# Patient Record
Sex: Female | Born: 1955 | Race: White | Hispanic: No | Marital: Married | Smoking: Never smoker
Health system: Southern US, Community
[De-identification: ages and names within clinical notes are randomized; demographics above are authoritative.]

## PROBLEM LIST (undated history)

## (undated) DIAGNOSIS — E785 Hyperlipidemia, unspecified: Secondary | ICD-10-CM

## (undated) DIAGNOSIS — K219 Gastro-esophageal reflux disease without esophagitis: Secondary | ICD-10-CM

## (undated) DIAGNOSIS — F419 Anxiety disorder, unspecified: Secondary | ICD-10-CM

## (undated) DIAGNOSIS — L570 Actinic keratosis: Secondary | ICD-10-CM

## (undated) DIAGNOSIS — T753XXA Motion sickness, initial encounter: Secondary | ICD-10-CM

## (undated) DIAGNOSIS — F32A Depression, unspecified: Secondary | ICD-10-CM

## (undated) DIAGNOSIS — F329 Major depressive disorder, single episode, unspecified: Secondary | ICD-10-CM

## (undated) HISTORY — DX: Major depressive disorder, single episode, unspecified: F32.9

## (undated) HISTORY — DX: Depression, unspecified: F32.A

## (undated) HISTORY — DX: Anxiety disorder, unspecified: F41.9

## (undated) HISTORY — DX: Gastro-esophageal reflux disease without esophagitis: K21.9

## (undated) HISTORY — DX: Actinic keratosis: L57.0

## (undated) HISTORY — DX: Hyperlipidemia, unspecified: E78.5

---

## 1982-03-30 HISTORY — PX: TUBAL LIGATION: SHX77

## 2004-07-03 ENCOUNTER — Ambulatory Visit: Payer: Self-pay

## 2004-07-16 ENCOUNTER — Ambulatory Visit: Payer: Self-pay

## 2005-10-14 ENCOUNTER — Ambulatory Visit: Payer: Self-pay

## 2006-02-24 ENCOUNTER — Ambulatory Visit: Payer: Self-pay | Admitting: Gastroenterology

## 2006-11-25 ENCOUNTER — Ambulatory Visit: Payer: Self-pay

## 2007-11-29 ENCOUNTER — Ambulatory Visit: Payer: Self-pay

## 2008-06-01 ENCOUNTER — Ambulatory Visit: Payer: Self-pay | Admitting: Family Medicine

## 2008-12-06 ENCOUNTER — Ambulatory Visit: Payer: Self-pay | Admitting: Family Medicine

## 2009-01-07 ENCOUNTER — Ambulatory Visit: Payer: Self-pay | Admitting: Family Medicine

## 2009-03-30 HISTORY — PX: COLONOSCOPY: SHX174

## 2009-07-18 ENCOUNTER — Ambulatory Visit: Payer: Self-pay | Admitting: Family Medicine

## 2009-12-11 ENCOUNTER — Ambulatory Visit: Payer: Self-pay | Admitting: Family Medicine

## 2011-02-10 ENCOUNTER — Ambulatory Visit: Payer: Self-pay | Admitting: Family Medicine

## 2012-02-12 ENCOUNTER — Ambulatory Visit: Payer: Self-pay | Admitting: Family Medicine

## 2013-01-24 ENCOUNTER — Ambulatory Visit: Payer: Self-pay | Admitting: Family Medicine

## 2013-01-27 ENCOUNTER — Ambulatory Visit: Payer: Self-pay | Admitting: Family Medicine

## 2013-02-13 ENCOUNTER — Ambulatory Visit: Payer: Self-pay | Admitting: Family Medicine

## 2014-02-19 ENCOUNTER — Ambulatory Visit: Payer: Self-pay | Admitting: Family Medicine

## 2014-04-02 ENCOUNTER — Ambulatory Visit: Payer: Self-pay | Admitting: Family Medicine

## 2014-09-14 ENCOUNTER — Other Ambulatory Visit: Payer: Self-pay

## 2014-09-14 ENCOUNTER — Other Ambulatory Visit: Payer: Self-pay | Admitting: Family Medicine

## 2014-09-14 DIAGNOSIS — K219 Gastro-esophageal reflux disease without esophagitis: Secondary | ICD-10-CM | POA: Insufficient documentation

## 2014-09-14 DIAGNOSIS — F419 Anxiety disorder, unspecified: Secondary | ICD-10-CM

## 2014-09-14 DIAGNOSIS — F32A Depression, unspecified: Secondary | ICD-10-CM | POA: Insufficient documentation

## 2014-09-14 DIAGNOSIS — F329 Major depressive disorder, single episode, unspecified: Secondary | ICD-10-CM | POA: Insufficient documentation

## 2014-09-14 DIAGNOSIS — E559 Vitamin D deficiency, unspecified: Secondary | ICD-10-CM | POA: Insufficient documentation

## 2014-09-14 DIAGNOSIS — R0989 Other specified symptoms and signs involving the circulatory and respiratory systems: Secondary | ICD-10-CM | POA: Insufficient documentation

## 2014-09-14 DIAGNOSIS — G4700 Insomnia, unspecified: Secondary | ICD-10-CM | POA: Insufficient documentation

## 2014-09-14 MED ORDER — ESCITALOPRAM OXALATE 10 MG PO TABS: 10.0000 mg | ORAL_TABLET | Freq: Every day | ORAL | Status: DC

## 2014-09-17 ENCOUNTER — Ambulatory Visit (INDEPENDENT_AMBULATORY_CARE_PROVIDER_SITE_OTHER): Payer: Managed Care, Other (non HMO) | Admitting: Physician Assistant

## 2014-09-17 ENCOUNTER — Encounter: Payer: Self-pay | Admitting: Physician Assistant

## 2014-09-17 VITALS — BP 140/82 | HR 54 | Temp 98.4°F | Resp 16 | Ht 59.0 in | Wt 127.0 lb

## 2014-09-17 DIAGNOSIS — F329 Major depressive disorder, single episode, unspecified: Secondary | ICD-10-CM | POA: Diagnosis not present

## 2014-09-17 DIAGNOSIS — K2100 Gastro-esophageal reflux disease with esophagitis, without bleeding: Secondary | ICD-10-CM

## 2014-09-17 DIAGNOSIS — F32A Depression, unspecified: Secondary | ICD-10-CM

## 2014-09-17 DIAGNOSIS — F419 Anxiety disorder, unspecified: Secondary | ICD-10-CM

## 2014-09-17 MED ORDER — ESCITALOPRAM OXALATE 10 MG PO TABS: 10.0000 mg | ORAL_TABLET | Freq: Every day | ORAL | Status: DC

## 2014-09-17 MED ORDER — PANTOPRAZOLE SODIUM 40 MG PO TBEC: 40.0000 mg | DELAYED_RELEASE_TABLET | Freq: Every day | ORAL | Status: DC

## 2014-09-17 NOTE — Patient Instructions (Signed)
Food Choices for Gastroesophageal Reflux Disease When you have gastroesophageal reflux disease (GERD), the foods you eat and your eating habits are very important. Choosing the right foods can help ease the discomfort of GERD. WHAT GENERAL GUIDELINES DO I NEED TO FOLLOW?  Choose fruits, vegetables, whole grains, low-fat dairy products, and low-fat meat, fish, and poultry.  Limit fats such as oils, salad dressings, butter, nuts, and avocado.  Keep a food diary to identify foods that cause symptoms.  Avoid foods that cause reflux. These may be different for different people.  Eat frequent small meals instead of three large meals each day.  Eat your meals slowly, in a relaxed setting.  Limit fried foods.  Cook foods using methods other than frying.  Avoid drinking alcohol.  Avoid drinking large amounts of liquids with your meals.  Avoid bending over or lying down until 2-3 hours after eating. WHAT FOODS ARE NOT RECOMMENDED? The following are some foods and drinks that may worsen your symptoms: Vegetables Tomatoes. Tomato juice. Tomato and spaghetti sauce. Chili peppers. Onion and garlic. Horseradish. Fruits Oranges, grapefruit, and lemon (fruit and juice). Meats High-fat meats, fish, and poultry. This includes hot dogs, ribs, ham, sausage, salami, and bacon. Dairy Whole milk and chocolate milk. Sour cream. Cream. Butter. Ice cream. Cream cheese.  Beverages Coffee and tea, with or without caffeine. Carbonated beverages or energy drinks. Condiments Hot sauce. Barbecue sauce.  Sweets/Desserts Chocolate and cocoa. Donuts. Peppermint and spearmint. Fats and Oils High-fat foods, including [place] fries and potato chips. Other Vinegar. Strong spices, such as black pepper, white pepper, red pepper, cayenne, curry powder, cloves, ginger, and chili powder. The items listed above may not be a complete list of foods and beverages to avoid. Contact your dietitian for more  information. Document Released: 03/16/2005 Document Revised: 03/21/2013 Document Reviewed: 01/18/2013 [facility] Patient Information 2015 [place], [place]. This information is not intended to replace advice given to you by your health care provider. Make sure you discuss any questions you have with your health care provider. Depression Depression refers to feeling sad, low, down in the dumps, blue, gloomy, or empty. In general, there are two kinds of depression:  Normal sadness or normal grief. This kind of depression is one that we all feel from time to time after upsetting life experiences, such as the loss of a job or the ending of a relationship. This kind of depression is considered normal, is short lived, and resolves within a few days to 2 weeks. Depression experienced after the loss of a loved one (bereavement) often lasts longer than 2 weeks but normally gets better with time.  Clinical depression. This kind of depression lasts longer than normal sadness or normal grief or interferes with your ability to function at home, at work, and in school. It also interferes with your personal relationships. It affects almost every aspect of your life. Clinical depression is an illness. Symptoms of depression can also be caused by conditions other than those mentioned above, such as:  Physical illness. Some physical illnesses, including underactive thyroid gland (hypothyroidism), severe anemia, specific types of cancer, diabetes, uncontrolled seizures, heart and lung problems, strokes, and chronic pain are commonly associated with symptoms of depression.  Side effects of some prescription medicine. In some people, certain types of medicine can cause symptoms of depression.  Substance abuse. Abuse of alcohol and illicit drugs can cause symptoms of depression. SYMPTOMS Symptoms of normal sadness and normal grief include the following:  Feeling sad or crying for short  periods of time.  Not caring about  anything (apathy).  Difficulty sleeping or sleeping too much.  No longer able to enjoy the things you used to enjoy.  Desire to be by oneself all the time (social isolation).  Lack of energy or motivation.  Difficulty concentrating or remembering.  Change in appetite or weight.  Restlessness or agitation. Symptoms of clinical depression include the same symptoms of normal sadness or normal grief and also the following symptoms:  Feeling sad or crying all the time.  Feelings of guilt or worthlessness.  Feelings of hopelessness or helplessness.  Thoughts of suicide or the desire to harm yourself (suicidal ideation).  Loss of touch with reality (psychotic symptoms). Seeing or hearing things that are not real (hallucinations) or having false beliefs about your life or the people around you (delusions and paranoia). DIAGNOSIS  The diagnosis of clinical depression is usually based on how bad the symptoms are and how long they have lasted. Your health care provider will also ask you questions about your medical history and substance use to find out if physical illness, use of prescription medicine, or substance abuse is causing your depression. Your health care provider may also order blood tests. TREATMENT  Often, normal sadness and normal grief do not require treatment. However, sometimes antidepressant medicine is given for bereavement to ease the depressive symptoms until they resolve. The treatment for clinical depression depends on how bad the symptoms are but often includes antidepressant medicine, counseling with a mental health professional, or both. Your health care provider will help to determine what treatment is best for you. Depression caused by physical illness usually goes away with appropriate medical treatment of the illness. If prescription medicine is causing depression, talk with your health care provider about stopping the medicine, decreasing the dose, or changing to  another medicine. Depression caused by the abuse of alcohol or illicit drugs goes away when you stop using these substances. Some adults need professional help in order to stop drinking or using drugs. SEEK IMMEDIATE MEDICAL CARE IF:  You have thoughts about hurting yourself or others.  You lose touch with reality (have psychotic symptoms).  You are taking medicine for depression and have a serious side effect. FOR MORE INFORMATION  National [employer] on Mental Illness: www.nami.[employer]: https://carter.com/ Document Released: 03/13/2000 Document Revised: 07/31/2013 Document Reviewed: 06/15/2011 [facility] Patient Information 2015 [place], [place]. This information is not intended to replace advice given to you by your health care provider. Make sure you discuss any questions you have with your health care provider.

## 2014-09-17 NOTE — Progress Notes (Signed)
Subjective:    Patient ID: Dawn Adkins, female    DOB: 02-Feb-1956, 59 y.o.   MRN: [id number]  Gastrophageal Reflux She complains of heartburn. This is a chronic problem. The problem occurs frequently. The problem has been unchanged. The heartburn is located in the substernum. The heartburn is of mild intensity. The heartburn does not wake her from sleep. The heartburn does not limit her activity. The heartburn doesn't change with position. The symptoms are aggravated by caffeine. Associated symptoms include fatigue. Pertinent negatives include no melena or muscle weakness. She has tried an antacid for the symptoms. The treatment provided significant relief.   Depression: Patient complains of depression. She complains of depressed mood. Onset was approximately several years ago, controlled on medication since that time.  She denies current suicidal and homicidal plan or intent.   Family history significant for no psychiatric illness.Possible organic causes contributing are: none.  Risk factors: none Previous treatment includes Lexapro and none. She complains of the following side effects from the treatment: none. Patient reports that she has been out of her medication times two weeks. Patient is requesting refills.  Patient Active Problem List   Diagnosis Date Noted  . Anxiety 09/14/2014  . Carotid artery bruit 09/14/2014  . Clinical depression 09/14/2014  . Acid reflux 09/14/2014  . Cannot sleep 09/14/2014  . Avitaminosis D 09/14/2014  . 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 Hypercholesterolemia without hypertriglyceridemia 08/21/2004  . Esophagitis, reflux 06/25/2004   Past Medical History  Diagnosis Date  . GERD (gastroesophageal reflux disease)   . Depression   . Anxiety   . Hyperlipidemia    Current Outpatient Prescriptions on File Prior to Visit  Medication Sig  . atorvastatin (LIPITOR) 10 MG tablet Take 1 tablet by mouth daily.  . Ergocalciferol (VITAMIN D2) 2000  UNITS TABS Take 1 tablet by mouth daily.  . traZODone (DESYREL) 50 MG tablet Take 1 tablet by mouth at bedtime.  Marland Kitchen dexlansoprazole (DEXILANT) 60 MG capsule Take 1 capsule by mouth daily.  Marland Kitchen escitalopram (LEXAPRO) 10 MG tablet Take 1 tablet (10 mg total) by mouth daily. (Patient not taking: Reported on 09/17/2014)   No current facility-administered medications on file prior to visit.   Not on File Past Surgical History  Procedure Laterality Date  . Tubal ligation  1984  . Colonoscopy  2011   History   Social History  . Marital Status: Married    Spouse Name: Celvin  . Number of Children: 3  . Years of Education: N/A   Occupational History  . part itme    Social History Main Topics  . Smoking status: Never Smoker   . Smokeless tobacco: Never Used  . Alcohol Use: 0.0 oz/week    0 Standard drinks or equivalent per week     Comment: OCCASIONALLY  . Drug Use: No  . Sexual Activity: Not on file   Other Topics Concern  . Not on file   Social History Narrative   Family History  Problem Relation Age of Onset  . Cervical cancer Mother   . Hypertension Mother   . Heart disease Mother   . Diabetes Son   . Mental illness Son   . Seizures Brother 69    fell from a 2 story building  . Seizures Brother   . Healthy Son   . Healthy Daughter        Review of Systems  Constitutional: Positive for fatigue.  Gastrointestinal: Positive for heartburn. Negative for melena.  Musculoskeletal:  Negative for muscle weakness.       Objective:   Physical Exam  Constitutional: She is oriented to person, place, and time. She appears well-developed and well-nourished. No distress.  HENT:  Head: Normocephalic and atraumatic.  Eyes: Conjunctivae are normal. Pupils are equal, round, and reactive to light.  Neck: Normal range of motion. Neck supple. No tracheal deviation present. No thyromegaly present.  Cardiovascular: Normal rate, regular rhythm, normal heart sounds and intact distal  pulses.  Exam reveals no gallop and no friction rub.   No murmur heard. Pulmonary/Chest: Effort normal and breath sounds normal. No respiratory distress. She has no wheezes. She has no rales. She exhibits no tenderness.  Lymphadenopathy:    She has no cervical adenopathy.  Neurological: She is alert and oriented to person, place, and time. No cranial nerve deficit. Coordination normal.  Skin: Skin is warm and dry. She is not diaphoretic.  Psychiatric: She has a normal mood and affect. Her behavior is normal. Judgment and thought content normal.  Vitals reviewed.   Blood pressure 140/82, pulse 54, temperature 98.4 F (36.9 C), temperature source Oral, resp. rate 16, height 4\' 11"  (1.499 m), weight 127 lb (57.607 kg).      Assessment & Plan:  1. Esophagitis, reflux Will switch to protonix due to insurance issues with obtaining dexilant.  - pantoprazole (PROTONIX) 40 MG tablet; Take 1 tablet (40 mg total) by mouth daily.  Dispense: 90 tablet; Refill: 3  2. Clinical depression Stable on lexapro.  Recheck in 6 months.  3. Anxiety Stable on lexapro.  Recheck in 6 months. - escitalopram (LEXAPRO) 10 MG tablet; Take 1 tablet (10 mg total) by mouth daily.  Dispense: 90 tablet; Refill: 3

## 2014-09-27 ENCOUNTER — Other Ambulatory Visit: Payer: Self-pay

## 2014-11-06 ENCOUNTER — Other Ambulatory Visit: Payer: Self-pay

## 2014-11-06 DIAGNOSIS — E7800 Pure hypercholesterolemia, unspecified: Secondary | ICD-10-CM

## 2014-11-07 MED ORDER — ATORVASTATIN CALCIUM 10 MG PO TABS: 10.0000 mg | ORAL_TABLET | Freq: Every day | ORAL | Status: DC

## 2015-03-14 ENCOUNTER — Ambulatory Visit (INDEPENDENT_AMBULATORY_CARE_PROVIDER_SITE_OTHER): Payer: Managed Care, Other (non HMO) | Admitting: Physician Assistant

## 2015-03-14 ENCOUNTER — Encounter: Payer: Self-pay | Admitting: Physician Assistant

## 2015-03-14 VITALS — BP 110/78 | HR 68 | Temp 98.2°F | Resp 16 | Wt 132.4 lb

## 2015-03-14 DIAGNOSIS — F419 Anxiety disorder, unspecified: Secondary | ICD-10-CM

## 2015-03-14 DIAGNOSIS — E7800 Pure hypercholesterolemia, unspecified: Secondary | ICD-10-CM

## 2015-03-14 DIAGNOSIS — F32A Depression, unspecified: Secondary | ICD-10-CM

## 2015-03-14 DIAGNOSIS — K219 Gastro-esophageal reflux disease without esophagitis: Secondary | ICD-10-CM

## 2015-03-14 DIAGNOSIS — F329 Major depressive disorder, single episode, unspecified: Secondary | ICD-10-CM

## 2015-03-14 DIAGNOSIS — E559 Vitamin D deficiency, unspecified: Secondary | ICD-10-CM | POA: Diagnosis not present

## 2015-03-14 DIAGNOSIS — Z23 Encounter for immunization: Secondary | ICD-10-CM

## 2015-03-14 NOTE — Progress Notes (Signed)
Patient: Dawn Adkins Female    DOB: [birth year]   59 y.o.   MRN: [id number] Visit Date: 03/14/2015  Today's Provider: Mar Daring, PA-C   Chief Complaint  Patient presents with  . Follow-up    6 month follow-up Depression and Anxiety   Subjective:    HPI Anxiety: Patient is here for her 6 month follow-up. She has the following symptoms: none.Patient is stable on Lexapro. She denies current suicidal and homicidal ideation. Family history significant for none.Previous treatment includes Lexapro and medication.  She complains of the following side effects from the treatment: none.  Depression: Patient is here following on depression. She has no complains, patient states everything is fine. She denies current suicidal and homicidal plan or intent.   Family history significant for no psychiatric illness. Previous treatment includes Lexapro. She complains of the following side effects from the treatment: none.     No Known Allergies Previous Medications   ATORVASTATIN (LIPITOR) 10 MG TABLET    Take 1 tablet (10 mg total) by mouth daily.   ERGOCALCIFEROL (VITAMIN D2) 2000 UNITS TABS    Take 1 tablet by mouth daily.   ESCITALOPRAM (LEXAPRO) 10 MG TABLET    Take 1 tablet (10 mg total) by mouth daily.   PANTOPRAZOLE (PROTONIX) 40 MG TABLET    Take 1 tablet (40 mg total) by mouth daily.   TRAZODONE (DESYREL) 50 MG TABLET    Take 1 tablet by mouth at bedtime.    Review of Systems  Constitutional: Negative.   HENT: Negative.   Eyes: Negative.   Respiratory: Negative.   Cardiovascular: Negative.   Gastrointestinal: Negative.   Endocrine: Negative.   Genitourinary: Negative.   Musculoskeletal: Negative.   Allergic/Immunologic: Negative.   Neurological: Negative.   Hematological: Negative.   Psychiatric/Behavioral: Negative.     Social History  Substance Use Topics  . Smoking status: Never Smoker   . Smokeless tobacco: Never Used  . Alcohol Use: 0.0 oz/week   0 Standard drinks or equivalent per week     Comment: OCCASIONALLY   Objective:   BP 110/78 mmHg  Pulse 68  Temp(Src) 98.2 F (36.8 C) (Oral)  Resp 16  Wt 132 lb 6.4 oz (60.056 kg)  Physical Exam  Constitutional: She appears well-developed and well-nourished. No distress.  HENT:  Head: Normocephalic and atraumatic.  Right Ear: Tympanic membrane and external ear normal.  Left Ear: Tympanic membrane and external ear normal.  Nose: Nose normal.  Mouth/Throat: Uvula is midline, oropharynx is clear and moist and mucous membranes are normal. No oropharyngeal exudate.  Eyes: Conjunctivae are normal. Pupils are equal, round, and reactive to light. Right eye exhibits no discharge. Left eye exhibits no discharge.  Neck: Normal range of motion. Neck supple. No JVD present. Carotid bruit is not present. No tracheal deviation present. No thyromegaly present.  Cardiovascular: Normal rate, regular rhythm and normal heart sounds.  Exam reveals no gallop and no friction rub.   No murmur heard. Pulmonary/Chest: Effort normal and breath sounds normal. No respiratory distress. She has no wheezes. She has no rales.  Lymphadenopathy:    She has no cervical adenopathy.  Skin: She is not diaphoretic.  Psychiatric: She has a normal mood and affect. Her behavior is normal. Judgment and thought content normal.  Vitals reviewed.       Assessment & Plan:     1. Anxiety Currently stable with Lexapro 10 mg. I will check labs as below and  follow-up pending lab results. If labs are stable she will not need labs drawn for another year. I will see her back in approximately one month for her annual physical exam. She is to call the office if she has any worsening symptoms, questions or concerns. - TSH  2. Clinical depression Currently stable with Lexapro 10 mg daily. I will check labs as below and follow-up pending lab results if labs are stable she will not need labs drawn for another year. I will see her back  in approximately 4 weeks for her complete annual physical exam. She is to call the office if she has any worsening symptoms, questions or concerns in the meantime. - CBC with Differential  3. Hypercholesterolemia without hypertriglyceridemia It has been one year since her previous cholesterol checked. She has been stable on atorvastatin 10 mg. Last year her cholesterol was completely normal. I will recheck a cholesterol panel today and follow-up pending results. If labs are stable she will not need her cholesterol checked for one year and we will continue atorvastatin. She is to call the office if she has any questions or concerns in the meantime. - Lipid panel  4. Avitaminosis D Has been stable on vitamin D 2000 units daily. I will check vitamin D level as below. If labs are stable she will continue current dose of vitamin D. She will not need to have her vitamin D level checked for another year if stable. I will follow-up with her pending lab results. She is to call the office if she has any questions or concerns. - Vitamin D (25 hydroxy)  5. Gastroesophageal reflux disease, esophagitis presence not specified Has been stable on Protonix 40 mg since starting in June. She states she has not had any acid reflux symptoms since starting. Check labs as below and follow-up pending lab results. She is to call the office she has any worsening symptoms, questions or concerns. - CBC with Differential - Comprehensive Metabolic Panel (CMET)  6. Need for influenza vaccination Flu vaccine was given today without complication. - Flu Vaccine QUAD 36+ mos IM       Mar Daring, PA-C  [facility]

## 2015-03-14 NOTE — Patient Instructions (Signed)
Major Depressive Disorder Major depressive disorder is a mental illness. It also may be called clinical depression or unipolar depression. Major depressive disorder usually causes feelings of sadness, hopelessness, or helplessness. Some people with this disorder do not feel particularly sad but lose interest in doing things they used to enjoy (anhedonia). Major depressive disorder also can cause physical symptoms. It can interfere with work, school, relationships, and other normal everyday activities. The disorder varies in severity but is longer lasting and more serious than the sadness we all feel from time to time in our lives. Major depressive disorder often is triggered by stressful life events or major life changes. Examples of these triggers include divorce, loss of your job or home, a move, and the death of a family member or close friend. Sometimes this disorder occurs for no obvious reason at all. People who have family members with major depressive disorder or bipolar disorder are at higher risk for developing this disorder, with or without life stressors. Major depressive disorder can occur at any age. It may occur just once in your life (single episode major depressive disorder). It may occur multiple times (recurrent major depressive disorder). SYMPTOMS People with major depressive disorder have either anhedonia or depressed mood on nearly a daily basis for at least 2 weeks or longer. Symptoms of depressed mood include:  Feelings of sadness (blue or down in the dumps) or emptiness.  Feelings of hopelessness or helplessness.  Tearfulness or episodes of crying (may be observed by others).  Irritability (children and adolescents). In addition to depressed mood or anhedonia or both, people with this disorder have at least four of the following symptoms:  Difficulty sleeping or sleeping too much.   Significant change (increase or decrease) in appetite or weight.   Lack of energy or  motivation.  Feelings of guilt and worthlessness.   Difficulty concentrating, remembering, or making decisions.  Unusually slow movement (psychomotor retardation) or restlessness (as observed by others).   Recurrent wishes for death, recurrent thoughts of self-harm (suicide), or a suicide attempt. People with major depressive disorder commonly have persistent negative thoughts about themselves, other people, and the world. People with severe major depressive disorder may experiencedistorted beliefs or perceptions about the world (psychotic delusions). They also may see or hear things that are not real (psychotic hallucinations). DIAGNOSIS Major depressive disorder is diagnosed through an assessment by your health care provider. Your health care provider will ask aboutaspects of your daily life, such as mood,sleep, and appetite, to see if you have the diagnostic symptoms of major depressive disorder. Your health care provider may ask about your medical history and use of alcohol or drugs, including prescription medicines. Your health care provider also may do a physical exam and blood work. This is because certain medical conditions and the use of certain substances can cause major depressive disorder-like symptoms (secondary depression). Your health care provider also may refer you to a mental health specialist for further evaluation and treatment. TREATMENT It is important to recognize the symptoms of major depressive disorder and seek treatment. The following treatments can be prescribed for this disorder:   Medicine. Antidepressant medicines usually are prescribed. Antidepressant medicines are thought to correct chemical imbalances in the brain that are commonly associated with major depressive disorder. Other types of medicine may be added if the symptoms do not respond to antidepressant medicines alone or if psychotic delusions or hallucinations occur.  Talk therapy. Talk therapy can be  helpful in treating major depressive disorder by providing   support, education, and guidance. Certain types of talk therapy also can help with negative thinking (cognitive behavioral therapy) and with relationship issues that trigger this disorder (interpersonal therapy). A mental health specialist can help determine which treatment is best for you. Most people with major depressive disorder do well with a combination of medicine and talk therapy. Treatments involving electrical stimulation of the brain can be used in situations with extremely severe symptoms or when medicine and talk therapy do not work over time. These treatments include electroconvulsive therapy, transcranial magnetic stimulation, and vagal nerve stimulation.   This information is not intended to replace advice given to you by your health care provider. Make sure you discuss any questions you have with your health care provider.   Document Released: 07/11/2012 Document Revised: 04/06/2014 Document Reviewed: 07/11/2012 Elsevier Interactive Patient Education 2016 [employer]  

## 2015-03-15 ENCOUNTER — Telehealth: Payer: Self-pay

## 2015-03-15 LAB — COMPREHENSIVE METABOLIC PANEL
ALBUMIN: 4.7 g/dL (ref 3.5–5.5)
ALK PHOS: 62 IU/L (ref 39–117)
ALT: 17 IU/L (ref 0–32)
AST: 16 IU/L (ref 0–40)
Albumin/Globulin Ratio: 2.1 (ref 1.1–2.5)
BUN/Creatinine Ratio: 26 — ABNORMAL HIGH (ref 9–23)
BUN: 17 mg/dL (ref 6–24)
Bilirubin Total: 0.5 mg/dL (ref 0.0–1.2)
CALCIUM: 10.1 mg/dL (ref 8.7–10.2)
CO2: 28 mmol/L (ref 18–29)
Chloride: 101 mmol/L (ref 96–106)
Creatinine, Ser: 0.65 mg/dL (ref 0.57–1.00)
GFR, EST AFRICAN AMERICAN: 114 mL/min/{1.73_m2} (ref >59)
GFR, EST NON AFRICAN AMERICAN: 99 mL/min/{1.73_m2} (ref >59)
GLOBULIN, TOTAL: 2.2 g/dL (ref 1.5–4.5)
GLUCOSE: 95 mg/dL (ref 65–99)
POTASSIUM: 4.7 mmol/L (ref 3.5–5.2)
SODIUM: 143 mmol/L (ref 134–144)
TOTAL PROTEIN: 6.9 g/dL (ref 6.0–8.5)

## 2015-03-15 LAB — CBC WITH DIFFERENTIAL/PLATELET
BASOS: 0 %
Basophils Absolute: 0 10*3/uL (ref 0.0–0.2)
EOS (ABSOLUTE): 0.2 10*3/uL (ref 0.0–0.4)
EOS: 2 %
HEMATOCRIT: 39.7 % (ref 34.0–46.6)
HEMOGLOBIN: 13.3 g/dL (ref 11.1–15.9)
IMMATURE GRANULOCYTES: 0 %
Immature Grans (Abs): 0 10*3/uL (ref 0.0–0.1)
Lymphocytes Absolute: 2.6 10*3/uL (ref 0.7–3.1)
Lymphs: 37 %
MCH: 29.5 pg (ref 26.6–33.0)
MCHC: 33.5 g/dL (ref 31.5–35.7)
MCV: 88 fL (ref 79–97)
MONOCYTES: 7 %
MONOS ABS: 0.5 10*3/uL (ref 0.1–0.9)
NEUTROS PCT: 54 %
Neutrophils Absolute: 3.7 10*3/uL (ref 1.4–7.0)
Platelets: 279 10*3/uL (ref 150–379)
RBC: 4.51 x10E6/uL (ref 3.77–5.28)
RDW: 13 % (ref 12.3–15.4)
WBC: 7 10*3/uL (ref 3.4–10.8)

## 2015-03-15 LAB — LIPID PANEL
CHOLESTEROL TOTAL: 179 mg/dL (ref 100–199)
Chol/HDL Ratio: 3.8 ratio units (ref 0.0–4.4)
HDL: 47 mg/dL (ref >39)
LDL Calculated: 96 mg/dL (ref 0–99)
Triglycerides: 179 mg/dL — ABNORMAL HIGH (ref 0–149)
VLDL CHOLESTEROL CAL: 36 mg/dL (ref 5–40)

## 2015-03-15 LAB — VITAMIN D 25 HYDROXY (VIT D DEFICIENCY, FRACTURES): Vit D, 25-Hydroxy: 56.9 ng/mL (ref 30.0–100.0)

## 2015-03-15 LAB — TSH: TSH: 2.4 u[IU]/mL (ref 0.450–4.500)

## 2015-03-15 NOTE — Telephone Encounter (Signed)
Patient's husband Avneet Barbati (consent in chart) advised as directed below. Calvin verbalized understanding.

## 2015-03-15 NOTE — Telephone Encounter (Signed)
-----   Message from Mar Daring, PA-C sent at 03/15/2015  8:26 AM EST ----- All labs are within normal limits and stable.  Triglycerides were borderline high at 179.  I would like below 150.  This is most closely related to diet.  Try to avoid or limit foods that have a high fat content, especially saturated fats.  Thanks! -JB

## 2015-04-11 ENCOUNTER — Encounter: Payer: Self-pay | Admitting: Physician Assistant

## 2015-04-11 ENCOUNTER — Ambulatory Visit (INDEPENDENT_AMBULATORY_CARE_PROVIDER_SITE_OTHER): Payer: Managed Care, Other (non HMO) | Admitting: Physician Assistant

## 2015-04-11 VITALS — BP 160/90 | HR 73 | Temp 97.8°F | Resp 16 | Wt 135.4 lb

## 2015-04-11 DIAGNOSIS — N760 Acute vaginitis: Secondary | ICD-10-CM

## 2015-04-11 DIAGNOSIS — Z0000 Encounter for general adult medical examination without abnormal findings: Secondary | ICD-10-CM | POA: Diagnosis not present

## 2015-04-11 DIAGNOSIS — N898 Other specified noninflammatory disorders of vagina: Secondary | ICD-10-CM

## 2015-04-11 DIAGNOSIS — A499 Bacterial infection, unspecified: Secondary | ICD-10-CM

## 2015-04-11 DIAGNOSIS — Z124 Encounter for screening for malignant neoplasm of cervix: Secondary | ICD-10-CM

## 2015-04-11 DIAGNOSIS — Z1211 Encounter for screening for malignant neoplasm of colon: Secondary | ICD-10-CM

## 2015-04-11 DIAGNOSIS — Z1239 Encounter for other screening for malignant neoplasm of breast: Secondary | ICD-10-CM

## 2015-04-11 DIAGNOSIS — B9689 Other specified bacterial agents as the cause of diseases classified elsewhere: Secondary | ICD-10-CM

## 2015-04-11 LAB — POCT WET PREP (WET MOUNT)

## 2015-04-11 MED ORDER — METRONIDAZOLE 500 MG PO TABS: 500.0000 mg | ORAL_TABLET | Freq: Two times a day (BID) | ORAL | Status: DC

## 2015-04-11 NOTE — Patient Instructions (Addendum)
Health Maintenance, Female Adopting a healthy lifestyle and getting preventive care can go a long way to promote health and wellness. Talk with your health care provider about what schedule of regular examinations is right for you. This is a good chance for you to check in with your provider about disease prevention and staying healthy. In between checkups, there are plenty of things you can do on your own. Experts have done a lot of research about which lifestyle changes and preventive measures are most likely to keep you healthy. Ask your health care provider for more information. WEIGHT AND DIET  Eat a healthy diet  Be sure to include plenty of vegetables, fruits, low-fat dairy products, and lean protein.  Do not eat a lot of foods high in solid fats, added sugars, or salt.  Get regular exercise. This is one of the most important things you can do for your health.  Most adults should exercise for at least 150 minutes each week. The exercise should increase your heart rate and make you sweat (moderate-intensity exercise).  Most adults should also do strengthening exercises at least twice a week. This is in addition to the moderate-intensity exercise.  Maintain a healthy weight  Body mass index (BMI) is a measurement that can be used to identify possible weight problems. It estimates body fat based on height and weight. Your health care provider can help determine your BMI and help you achieve or maintain a healthy weight.  For females 28 years of age and older:   A BMI below 18.5 is considered underweight.  A BMI of 18.5 to 24.9 is normal.  A BMI of 25 to 29.9 is considered overweight.  A BMI of 30 and above is considered obese.  Watch levels of cholesterol and blood lipids  You should start having your blood tested for lipids and cholesterol at 58 years of age, then have this test every 5 years.  You may need to have your cholesterol levels checked more often if:  Your lipid  or cholesterol levels are high.  You are older than 58 years of age.  You are at high risk for heart disease.  CANCER SCREENING   Lung Cancer  Lung cancer screening is recommended for adults 75-66 years old who are at high risk for lung cancer because of a history of smoking.  A yearly low-dose CT scan of the lungs is recommended for people who:  Currently smoke.  Have quit within the past 15 years.  Have at least a 30-pack-year history of smoking. A pack year is smoking an average of one pack of cigarettes a day for 1 year.  Yearly screening should continue until it has been 15 years since you quit.  Yearly screening should stop if you develop a health problem that would prevent you from having lung cancer treatment.  Breast Cancer  Practice breast self-awareness. This means understanding how your breasts normally appear and feel.  It also means doing regular breast self-exams. Let your health care provider know about any changes, no matter how small.  If you are in your 20s or 30s, you should have a clinical breast exam (CBE) by a health care provider every 1-3 years as part of a regular health exam.  If you are 25 or older, have a CBE every year. Also consider having a breast X-ray (mammogram) every year.  If you have a family history of breast cancer, talk to your health care provider about genetic screening.  If you  are at high risk for breast cancer, talk to your health care provider about having an MRI and a mammogram every year.  Breast cancer gene (BRCA) assessment is recommended for women who have family members with BRCA-related cancers. BRCA-related cancers include:  Breast.  Ovarian.  Tubal.  Peritoneal cancers.  Results of the assessment will determine the need for genetic counseling and BRCA1 and BRCA2 testing. Cervical Cancer Your health care provider may recommend that you be screened regularly for cancer of the pelvic organs (ovaries, uterus, and  vagina). This screening involves a pelvic examination, including checking for microscopic changes to the surface of your cervix (Pap test). You may be encouraged to have this screening done every 3 years, beginning at age 21.  For women ages 30-65, health care providers may recommend pelvic exams and Pap testing every 3 years, or they may recommend the Pap and pelvic exam, combined with testing for human papilloma virus (HPV), every 5 years. Some types of HPV increase your risk of cervical cancer. Testing for HPV may also be done on women of any age with unclear Pap test results.  Other health care providers may not recommend any screening for nonpregnant women who are considered low risk for pelvic cancer and who do not have symptoms. Ask your health care provider if a screening pelvic exam is right for you.  If you have had past treatment for cervical cancer or a condition that could lead to cancer, you need Pap tests and screening for cancer for at least 20 years after your treatment. If Pap tests have been discontinued, your risk factors (such as having a new sexual partner) need to be reassessed to determine if screening should resume. Some women have medical problems that increase the chance of getting cervical cancer. In these cases, your health care provider may recommend more frequent screening and Pap tests. Colorectal Cancer  This type of cancer can be detected and often prevented.  Routine colorectal cancer screening usually begins at 58 years of age and continues through 58 years of age.  Your health care provider may recommend screening at an earlier age if you have risk factors for colon cancer.  Your health care provider may also recommend using home test kits to check for hidden blood in the stool.  A small camera at the end of a tube can be used to examine your colon directly (sigmoidoscopy or colonoscopy). This is done to check for the earliest forms of colorectal  cancer.  Routine screening usually begins at age 50.  Direct examination of the colon should be repeated every 5-10 years through 58 years of age. However, you may need to be screened more often if early forms of precancerous polyps or small growths are found. Skin Cancer  Check your skin from head to toe regularly.  Tell your health care provider about any new moles or changes in moles, especially if there is a change in a mole's shape or color.  Also tell your health care provider if you have a mole that is larger than the size of a pencil eraser.  Always use sunscreen. Apply sunscreen liberally and repeatedly throughout the day.  Protect yourself by wearing long sleeves, pants, a wide-brimmed hat, and sunglasses whenever you are outside. HEART DISEASE, DIABETES, AND HIGH BLOOD PRESSURE   High blood pressure causes heart disease and increases the risk of stroke. High blood pressure is more likely to develop in:  People who have blood pressure in the high end   of the normal range (130-139/85-89 mm Hg).  People who are overweight or obese.  People who are African American.  If you are 48-60 years of age, have your blood pressure checked every 3-5 years. If you are 65 years of age or older, have your blood pressure checked every year. You should have your blood pressure measured twice--once when you are at a hospital or clinic, and once when you are not at a hospital or clinic. Record the average of the two measurements. To check your blood pressure when you are not at a hospital or clinic, you can use:  An automated blood pressure machine at a pharmacy.  A home blood pressure monitor.  If you are between 55 years and 82 years old, ask your health care provider if you should take aspirin to prevent strokes.  Have regular diabetes screenings. This involves taking a blood sample to check your fasting blood sugar level.  If you are at a normal weight and have a low risk for diabetes,  have this test once every three years after 58 years of age.  If you are overweight and have a high risk for diabetes, consider being tested at a younger age or more often. PREVENTING INFECTION  Hepatitis B  If you have a higher risk for hepatitis B, you should be screened for this virus. You are considered at high risk for hepatitis B if:  You were born in a country where hepatitis B is common. Ask your health care provider which countries are considered high risk.  Your parents were born in a high-risk country, and you have not been immunized against hepatitis B (hepatitis B vaccine).  You have HIV or AIDS.  You use needles to inject street drugs.  You live with someone who has hepatitis B.  You have had sex with someone who has hepatitis B.  You get hemodialysis treatment.  You take certain medicines for conditions, including cancer, organ transplantation, and autoimmune conditions. Hepatitis C  Blood testing is recommended for:  Everyone born from [birth year] through 1963.  Anyone with known risk factors for hepatitis C. Sexually transmitted infections (STIs)  You should be screened for sexually transmitted infections (STIs) including gonorrhea and chlamydia if:  You are sexually active and are younger than 58 years of age.  You are older than 58 years of age and your health care provider tells you that you are at risk for this type of infection.  Your sexual activity has changed since you were last screened and you are at an increased risk for chlamydia or gonorrhea. Ask your health care provider if you are at risk.  If you do not have HIV, but are at risk, it may be recommended that you take a prescription medicine daily to prevent HIV infection. This is called pre-exposure prophylaxis (PrEP). You are considered at risk if:  You are sexually active and do not regularly use condoms or know the HIV status of your partner(s).  You take drugs by injection.  You are sexually  active with a partner who has HIV. Talk with your health care provider about whether you are at high risk of being infected with HIV. If you choose to begin PrEP, you should first be tested for HIV. You should then be tested every 3 months for as long as you are taking PrEP.  PREGNANCY   If you are premenopausal and you may become pregnant, ask your health care provider about preconception counseling.  If you may  become pregnant, take 400 to 800 micrograms (mcg) of folic acid every day.  If you want to prevent pregnancy, talk to your health care provider about birth control (contraception). OSTEOPOROSIS AND MENOPAUSE   Osteoporosis is a disease in which the bones lose minerals and strength with aging. This can result in serious bone fractures. Your risk for osteoporosis can be identified using a bone density scan.  If you are 10 years of age or older, or if you are at risk for osteoporosis and fractures, ask your health care provider if you should be screened.  Ask your health care provider whether you should take a calcium or vitamin D supplement to lower your risk for osteoporosis.  Menopause may have certain physical symptoms and risks.  Hormone replacement therapy may reduce some of these symptoms and risks. Talk to your health care provider about whether hormone replacement therapy is right for you.  HOME CARE INSTRUCTIONS   Schedule regular health, dental, and eye exams.  Stay current with your immunizations.   Do not use any tobacco products including cigarettes, chewing tobacco, or electronic cigarettes.  If you are pregnant, do not drink alcohol.  If you are breastfeeding, limit how much and how often you drink alcohol.  Limit alcohol intake to no more than 1 drink per day for nonpregnant women. One drink equals 12 ounces of beer, 5 ounces of wine, or 1 ounces of hard liquor.  Do not use street drugs.  Do not share needles.  Ask your health care provider for help if  you need support or information about quitting drugs.  Tell your health care provider if you often feel depressed.  Tell your health care provider if you have ever been abused or do not feel safe at home.   This information is not intended to replace advice given to you by your health care provider. Make sure you discuss any questions you have with your health care provider.   Document Released: 09/29/2010 Document Revised: 04/06/2014 Document Reviewed: 02/15/2013 Elsevier Interactive Patient Education [employer].  Colonoscopy A colonoscopy is an exam to look at the entire large intestine (colon). This exam can help find problems such as tumors, polyps, inflammation, and areas of bleeding. The exam takes about 1 hour.  LET [facility] CARE PROVIDER KNOW ABOUT:   Any allergies you have.  All medicines you are taking, including vitamins, herbs, eye drops, creams, and over-the-counter medicines.  Previous problems you or members of your family have had with the use of anesthetics.  Any blood disorders you have.  Previous surgeries you have had.  Medical conditions you have. RISKS AND COMPLICATIONS  Generally, this is a safe procedure. However, as with any procedure, complications can occur. Possible complications include:  Bleeding.  Tearing or rupture of the colon wall.  Reaction to medicines given during the exam.  Infection (rare). BEFORE THE PROCEDURE   Ask your health care provider about changing or stopping your regular medicines.  You may be prescribed an oral bowel prep. This involves drinking a large amount of medicated liquid, starting the day before your procedure. The liquid will cause you to have multiple loose stools until your stool is almost clear or light green. This cleans out your colon in preparation for the procedure.  Do not eat or drink anything else once you have started the bowel prep, unless your health care provider tells you it is safe to do  so.  Arrange for someone to drive you home  after the procedure. PROCEDURE   You will be given medicine to help you relax (sedative).  You will lie on your side with your knees bent.  A long, flexible tube with a light and camera on the end (colonoscope) will be inserted through the rectum and into the colon. The camera sends video back to a computer screen as it moves through the colon. The colonoscope also releases carbon dioxide gas to inflate the colon. This helps your health care provider see the area better.  During the exam, your health care provider may take a small tissue sample (biopsy) to be examined under a microscope if any abnormalities are found.  The exam is finished when the entire colon has been viewed. AFTER THE PROCEDURE   Do not drive for 24 hours after the exam.  You may have a small amount of blood in your stool.  You may pass moderate amounts of gas and have mild abdominal cramping or bloating. This is caused by the gas used to inflate your colon during the exam.  Ask when your test results will be ready and how you will get your results. Make sure you get your test results.   This information is not intended to replace advice given to you by your health care provider. Make sure you discuss any questions you have with your health care provider.   Document Released: 03/13/2000 Document Revised: 01/04/2013 Document Reviewed: 11/21/2012 Elsevier Interactive Patient Education 2016 Elsevier Inc[employer]  Fat and Cholesterol Restricted Diet High levels of fat and cholesterol in your blood may lead to various health problems, such as diseases of the heart, blood vessels, gallbladder, liver, and pancreas. Fats are concentrated sources of energy that come in various forms. Certain types of fat, including saturated fat, may be harmful in excess. Cholesterol is a substance needed by your body in small amounts. Your body makes all the cholesterol it needs. Excess cholesterol comes  from the food you eat. When you have high levels of cholesterol and saturated fat in your blood, health problems can develop because the excess fat and cholesterol will gather along the walls of your blood vessels, causing them to narrow. Choosing the right foods will help you control your intake of fat and cholesterol. This will help keep the levels of these substances in your blood within normal limits and reduce your risk of disease. WHAT IS MY PLAN? Your health care provider recommends that you:  Get no more than __________ % of the total calories in your daily diet from fat.  Limit your intake of saturated fat to less than ______% of your total calories each day.  Limit the amount of cholesterol in your diet to less than _________mg per day. WHAT TYPES OF FAT SHOULD I CHOOSE?  Choose healthy fats more often. Choose monounsaturated and polyunsaturated fats, such as olive and canola oil, flaxseeds, walnuts, almonds, and seeds.  Eat more omega-3 fats. Good choices include salmon, mackerel, sardines, tuna, flaxseed oil, and ground flaxseeds. Aim to eat fish at least two times a week.  Limit saturated fats. Saturated fats are primarily found in animal products, such as meats, butter, and cream. Plant sources of saturated fats include palm oil, palm kernel oil, and coconut oil.  Avoid foods with partially hydrogenated oils in them. These contain trans fats. Examples of foods that contain trans fats are stick margarine, some tub margarines, cookies, crackers, and other baked goods. WHAT GENERAL GUIDELINES DO I NEED TO FOLLOW? These guidelines for healthy eating  will help you control your intake of fat and cholesterol:  Check food labels carefully to identify foods with trans fats or high amounts of saturated fat.  Fill one half of your plate with vegetables and green salads.  Fill one fourth of your plate with whole grains. Look for the word "whole" as the first word in the ingredient  list.  Fill one fourth of your plate with lean protein foods.  Limit fruit to two servings a day. Choose fruit instead of juice.  Eat more foods that contain soluble fiber. Examples of foods that contain this type of fiber are apples, broccoli, carrots, beans, peas, and barley. Aim to get 20-30 g of fiber per day.  Eat more home-cooked food and less restaurant, buffet, and fast food.  Limit or avoid alcohol.  Limit foods high in starch and sugar.  Limit fried foods.  Cook foods using methods other than frying. Baking, boiling, grilling, and broiling are all great options.  Lose weight if you are overweight. Losing just 5-10% of your initial body weight can help your overall health and prevent diseases such as diabetes and heart disease. WHAT FOODS CAN I EAT? Grains Whole grains, such as whole wheat or whole grain breads, crackers, cereals, and pasta. Unsweetened oatmeal, bulgur, barley, quinoa, or brown rice. Corn or whole wheat flour tortillas. Vegetables Fresh or frozen vegetables (raw, steamed, roasted, or grilled). Green salads. Fruits All fresh, canned (in natural juice), or frozen fruits. Meat and Other Protein Products Ground beef (85% or leaner), grass-fed beef, or beef trimmed of fat. Skinless chicken or [place]. Ground chicken or [place]. Pork trimmed of fat. All fish and seafood. Eggs. Dried beans, peas, or lentils. Unsalted nuts or seeds. Unsalted canned or dry beans. Dairy Low-fat dairy products, such as skim or 1% milk, 2% or reduced-fat cheeses, low-fat ricotta or cottage cheese, or plain low-fat yogurt. Fats and Oils Tub margarines without trans fats. Light or reduced-fat mayonnaise and salad dressings. Avocado. Olive, canola, sesame, or safflower oils. Natural peanut or almond butter (choose ones without added sugar and oil). The items listed above may not be a complete list of recommended foods or beverages. Contact your dietitian for more options. WHAT FOODS ARE NOT  RECOMMENDED? Grains White bread. White pasta. White rice. Cornbread. Bagels, pastries, and croissants. Crackers that contain trans fat. Vegetables White potatoes. Corn. Creamed or fried vegetables. Vegetables in a cheese sauce. Fruits Dried fruits. Canned fruit in light or heavy syrup. Fruit juice. Meat and Other Protein Products Fatty cuts of meat. Ribs, chicken wings, bacon, sausage, bologna, salami, chitterlings, fatback, hot dogs, bratwurst, and packaged luncheon meats. Liver and organ meats. Dairy Whole or 2% milk, cream, half-and-half, and cream cheese. Whole milk cheeses. Whole-fat or sweetened yogurt. Full-fat cheeses. Nondairy creamers and whipped toppings. Processed cheese, cheese spreads, or cheese curds. Sweets and Desserts Corn syrup, sugars, honey, and molasses. Candy. Jam and jelly. Syrup. Sweetened cereals. Cookies, pies, cakes, donuts, muffins, and ice cream. Fats and Oils Butter, stick margarine, lard, shortening, ghee, or bacon fat. Coconut, palm kernel, or palm oils. Beverages Alcohol. Sweetened drinks (such as sodas, lemonade, and fruit drinks or punches). The items listed above may not be a complete list of foods and beverages to avoid. Contact your dietitian for more information.   This information is not intended to replace advice given to you by your health care provider. Make sure you discuss any questions you have with your health care provider.   Document Released: 03/16/2005 Document  Revised: 04/06/2014 Document Reviewed: 06/14/2013 Elsevier Interactive Patient Education 2016 [place] [facility]) Exercise Recommendation  Being physically active is important to prevent heart disease and stroke, the nation's No. 1and No. 5killers. To improve overall cardiovascular health, we suggest at least 150 minutes per week of moderate exercise or 75 minutes per week of vigorous exercise (or a combination of moderate and vigorous activity). Thirty  minutes a day, five times a week is an easy goal to remember. You will also experience benefits even if you divide your time into two or three segments of 10 to 15 minutes per day.  For people who would benefit from lowering their blood pressure or cholesterol, we recommend 40 minutes of aerobic exercise of moderate to vigorous intensity three to four times a week to lower the risk for heart attack and stroke.  Physical activity is anything that makes you move your body and burn calories.  This includes things like climbing stairs or playing sports. Aerobic exercises benefit your heart, and include walking, jogging, swimming or biking. Strength and stretching exercises are best for overall stamina and flexibility.  The simplest, positive change you can make to effectively improve your heart health is to start walking. It's enjoyable, free, easy, social and great exercise. A walking program is flexible and boasts high success rates because people can stick with it. It's easy for walking to become a regular and satisfying part of life.   For Overall Cardiovascular Health:  At least 30 minutes of moderate-intensity aerobic activity at least 5 days per week for a total of 150  OR   At least 25 minutes of vigorous aerobic activity at least 3 days per week for a total of 75 minutes; or a combination of moderate- and vigorous-intensity aerobic activity  AND   Moderate- to high-intensity muscle-strengthening activity at least 2 days per week for additional health benefits.  For Lowering Blood Pressure and Cholesterol  An average 40 minutes of moderate- to vigorous-intensity aerobic activity 3 or 4 times per week  What if I can't make it to the time goal? Something is always better than nothing! And everyone has to start somewhere. Even if you've been sedentary for years, today is the day you can begin to make healthy changes in your life. If you don't think you'll make it for 30 or 40 minutes,  set a reachable goal for today. You can work up toward your overall goal by increasing your time as you get stronger. Don't let all-or-nothing thinking rob you of doing what you can every day.  Source:http://www.heart.org   Health Maintenance, Female Adopting a healthy lifestyle and getting preventive care can go a long way to promote health and wellness. Talk with your health care provider about what schedule of regular examinations is right for you. This is a good chance for you to check in with your provider about disease prevention and staying healthy. In between checkups, there are plenty of things you can do on your own. Experts have done a lot of research about which lifestyle changes and preventive measures are most likely to keep you healthy. Ask your health care provider for more information. WEIGHT AND DIET  Eat a healthy diet  Be sure to include plenty of vegetables, fruits, low-fat dairy products, and lean protein.  Do not eat a lot of foods high in solid fats, added sugars, or salt.  Get regular exercise. This is one of the most important things you can do  for your health.  Most adults should exercise for at least 150 minutes each week. The exercise should increase your heart rate and make you sweat (moderate-intensity exercise).  Most adults should also do strengthening exercises at least twice a week. This is in addition to the moderate-intensity exercise.  Maintain a healthy weight  Body mass index (BMI) is a measurement that can be used to identify possible weight problems. It estimates body fat based on height and weight. Your health care provider can help determine your BMI and help you achieve or maintain a healthy weight.  For females 49 years of age and older:   A BMI below 18.5 is considered underweight.  A BMI of 18.5 to 24.9 is normal.  A BMI of 25 to 29.9 is considered overweight.  A BMI of 30 and above is considered obese.  Watch levels of cholesterol  and blood lipids  You should start having your blood tested for lipids and cholesterol at 58 years of age, then have this test every 5 years.  You may need to have your cholesterol levels checked more often if:  Your lipid or cholesterol levels are high.  You are older than 58 years of age.  You are at high risk for heart disease.  CANCER SCREENING   Lung Cancer  Lung cancer screening is recommended for adults 3-22 years old who are at high risk for lung cancer because of a history of smoking.  A yearly low-dose CT scan of the lungs is recommended for people who:  Currently smoke.  Have quit within the past 15 years.  Have at least a 30-pack-year history of smoking. A pack year is smoking an average of one pack of cigarettes a day for 1 year.  Yearly screening should continue until it has been 15 years since you quit.  Yearly screening should stop if you develop a health problem that would prevent you from having lung cancer treatment.  Breast Cancer  Practice breast self-awareness. This means understanding how your breasts normally appear and feel.  It also means doing regular breast self-exams. Let your health care provider know about any changes, no matter how small.  If you are in your 20s or 30s, you should have a clinical breast exam (CBE) by a health care provider every 1-3 years as part of a regular health exam.  If you are 75 or older, have a CBE every year. Also consider having a breast X-ray (mammogram) every year.  If you have a family history of breast cancer, talk to your health care provider about genetic screening.  If you are at high risk for breast cancer, talk to your health care provider about having an MRI and a mammogram every year.  Breast cancer gene (BRCA) assessment is recommended for women who have family members with BRCA-related cancers. BRCA-related cancers include:  Breast.  Ovarian.  Tubal.  Peritoneal cancers.  Results of the  assessment will determine the need for genetic counseling and BRCA1 and BRCA2 testing. Cervical Cancer Your health care provider may recommend that you be screened regularly for cancer of the pelvic organs (ovaries, uterus, and vagina). This screening involves a pelvic examination, including checking for microscopic changes to the surface of your cervix (Pap test). You may be encouraged to have this screening done every 3 years, beginning at age 71.  For women ages 50-65, health care providers may recommend pelvic exams and Pap testing every 3 years, or they may recommend the Pap and pelvic  exam, combined with testing for human papilloma virus (HPV), every 5 years. Some types of HPV increase your risk of cervical cancer. Testing for HPV may also be done on women of any age with unclear Pap test results.  Other health care providers may not recommend any screening for nonpregnant women who are considered low risk for pelvic cancer and who do not have symptoms. Ask your health care provider if a screening pelvic exam is right for you.  If you have had past treatment for cervical cancer or a condition that could lead to cancer, you need Pap tests and screening for cancer for at least 20 years after your treatment. If Pap tests have been discontinued, your risk factors (such as having a new sexual partner) need to be reassessed to determine if screening should resume. Some women have medical problems that increase the chance of getting cervical cancer. In these cases, your health care provider may recommend more frequent screening and Pap tests. Colorectal Cancer  This type of cancer can be detected and often prevented.  Routine colorectal cancer screening usually begins at 58 years of age and continues through 58 years of age.  Your health care provider may recommend screening at an earlier age if you have risk factors for colon cancer.  Your health care provider may also recommend using home test kits  to check for hidden blood in the stool.  A small camera at the end of a tube can be used to examine your colon directly (sigmoidoscopy or colonoscopy). This is done to check for the earliest forms of colorectal cancer.  Routine screening usually begins at age 74.  Direct examination of the colon should be repeated every 5-10 years through 58 years of age. However, you may need to be screened more often if early forms of precancerous polyps or small growths are found. Skin Cancer  Check your skin from head to toe regularly.  Tell your health care provider about any new moles or changes in moles, especially if there is a change in a mole's shape or color.  Also tell your health care provider if you have a mole that is larger than the size of a pencil eraser.  Always use sunscreen. Apply sunscreen liberally and repeatedly throughout the day.  Protect yourself by wearing long sleeves, pants, a wide-brimmed hat, and sunglasses whenever you are outside. HEART DISEASE, DIABETES, AND HIGH BLOOD PRESSURE   High blood pressure causes heart disease and increases the risk of stroke. High blood pressure is more likely to develop in:  People who have blood pressure in the high end of the normal range (130-139/85-89 mm Hg).  People who are overweight or obese.  People who are African American.  If you are 77-9 years of age, have your blood pressure checked every 3-5 years. If you are 69 years of age or older, have your blood pressure checked every year. You should have your blood pressure measured twice--once when you are at a hospital or clinic, and once when you are not at a hospital or clinic. Record the average of the two measurements. To check your blood pressure when you are not at a hospital or clinic, you can use:  An automated blood pressure machine at a pharmacy.  A home blood pressure monitor.  If you are between 15 years and 70 years old, ask your health care provider if you should  take aspirin to prevent strokes.  Have regular diabetes screenings. This involves taking a blood  sample to check your fasting blood sugar level.  If you are at a normal weight and have a low risk for diabetes, have this test once every three years after 58 years of age.  If you are overweight and have a high risk for diabetes, consider being tested at a younger age or more often. PREVENTING INFECTION  Hepatitis B  If you have a higher risk for hepatitis B, you should be screened for this virus. You are considered at high risk for hepatitis B if:  You were born in a country where hepatitis B is common. Ask your health care provider which countries are considered high risk.  Your parents were born in a high-risk country, and you have not been immunized against hepatitis B (hepatitis B vaccine).  You have HIV or AIDS.  You use needles to inject street drugs.  You live with someone who has hepatitis B.  You have had sex with someone who has hepatitis B.  You get hemodialysis treatment.  You take certain medicines for conditions, including cancer, organ transplantation, and autoimmune conditions. Hepatitis C  Blood testing is recommended for:  Everyone born from [birth year] through 1963.  Anyone with known risk factors for hepatitis C. Sexually transmitted infections (STIs)  You should be screened for sexually transmitted infections (STIs) including gonorrhea and chlamydia if:  You are sexually active and are younger than 58 years of age.  You are older than 58 years of age and your health care provider tells you that you are at risk for this type of infection.  Your sexual activity has changed since you were last screened and you are at an increased risk for chlamydia or gonorrhea. Ask your health care provider if you are at risk.  If you do not have HIV, but are at risk, it may be recommended that you take a prescription medicine daily to prevent HIV infection. This is called  pre-exposure prophylaxis (PrEP). You are considered at risk if:  You are sexually active and do not regularly use condoms or know the HIV status of your partner(s).  You take drugs by injection.  You are sexually active with a partner who has HIV. Talk with your health care provider about whether you are at high risk of being infected with HIV. If you choose to begin PrEP, you should first be tested for HIV. You should then be tested every 3 months for as long as you are taking PrEP.  PREGNANCY   If you are premenopausal and you may become pregnant, ask your health care provider about preconception counseling.  If you may become pregnant, take 400 to 800 micrograms (mcg) of folic acid every day.  If you want to prevent pregnancy, talk to your health care provider about birth control (contraception). OSTEOPOROSIS AND MENOPAUSE   Osteoporosis is a disease in which the bones lose minerals and strength with aging. This can result in serious bone fractures. Your risk for osteoporosis can be identified using a bone density scan.  If you are 19 years of age or older, or if you are at risk for osteoporosis and fractures, ask your health care provider if you should be screened.  Ask your health care provider whether you should take a calcium or vitamin D supplement to lower your risk for osteoporosis.  Menopause may have certain physical symptoms and risks.  Hormone replacement therapy may reduce some of these symptoms and risks. Talk to your health care provider about whether hormone replacement therapy is  right for you.  HOME CARE INSTRUCTIONS   Schedule regular health, dental, and eye exams.  Stay current with your immunizations.   Do not use any tobacco products including cigarettes, chewing tobacco, or electronic cigarettes.  If you are pregnant, do not drink alcohol.  If you are breastfeeding, limit how much and how often you drink alcohol.  Limit alcohol intake to no more than 1  drink per day for nonpregnant women. One drink equals 12 ounces of beer, 5 ounces of wine, or 1 ounces of hard liquor.  Do not use street drugs.  Do not share needles.  Ask your health care provider for help if you need support or information about quitting drugs.  Tell your health care provider if you often feel depressed.  Tell your health care provider if you have ever been abused or do not feel safe at home.   This information is not intended to replace advice given to you by your health care provider. Make sure you discuss any questions you have with your health care provider.   Document Released: 09/29/2010 Document Revised: 04/06/2014 Document Reviewed: 02/15/2013 Elsevier Interactive Patient Education [employer].

## 2015-04-11 NOTE — Progress Notes (Signed)
Patient: Dawn Adkins, Female    DOB: July 08, 1955, 60 y.o.   MRN: [id number] Visit Date: 04/11/2015  Today's Provider: Mar Daring, PA-C   Chief Complaint  Patient presents with  . Annual Exam   Subjective:    Annual physical exam Dawn Adkins is a 60 y.o. female who presents today for health maintenance and complete physical. She feels well. She reports not exercising. She reports she is sleeping well.  PCP:01/26/14 BMD: 02/15/14 Normal Pap: 01/30/14 Normal and HPV Negative; positive family history of cervical cancer in her mother that was cause of death. Mammogram:2014/03/06 BI-RADS Category 1: Negative; no family history of breast cancer. Colonoscopy: 03/03/10 By Dr. Dionne Milo. Internal Hemorrhoids. Repeat in 5 years. No family history of colon cancer. -----------------------------------------------------------------   Review of Systems  Constitutional: Negative.   HENT: Positive for rhinorrhea and sneezing.   Eyes: Negative.   Respiratory: Negative.   Cardiovascular: Negative.   Gastrointestinal: Negative.   Endocrine: Negative.   Genitourinary: Positive for vaginal discharge.  Musculoskeletal: Positive for back pain.  Skin: Negative.   Allergic/Immunologic: Negative.   Neurological: Positive for numbness (in her fingers on and off;chronic).  Hematological: Negative.   Psychiatric/Behavioral: Negative.     Social History      She  reports that she has never smoked. She has never used smokeless tobacco. She reports that she drinks alcohol. She reports that she does not use illicit drugs.       Social History   Social History  . Marital Status: Married    Spouse Name: Celvin  . Number of Children: 3  . Years of Education: N/A   Occupational History  . part itme    Social History Main Topics  . Smoking status: Never Smoker   . Smokeless tobacco: Never Used  . Alcohol Use: 0.0 oz/week    0 Standard drinks or equivalent per week   Comment: OCCASIONALLY  . Drug Use: No  . Sexual Activity: Not Asked   Other Topics Concern  . None   Social History Narrative    Past Medical History  Diagnosis Date  . GERD (gastroesophageal reflux disease)   . Depression   . Anxiety   . Hyperlipidemia      Patient Active Problem List   Diagnosis Date Noted  . Anxiety 09/14/2014  . Clinical depression 09/14/2014  . Acid reflux 09/14/2014  . Cannot sleep 09/14/2014  . Avitaminosis D 09/14/2014  . 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 Hypercholesterolemia without hypertriglyceridemia 08/21/2004    Past Surgical History  Procedure Laterality Date  . Tubal ligation  1984  . Colonoscopy  2011    Family History        Family Status  Relation Status Death Age  . Mother Deceased   . Son Alive   . Father Deceased   . Brother Alive   . Brother Alive   . Brother Alive   . Son Alive   . Daughter Alive         Her family history includes Cervical cancer in her mother; Diabetes in her son; Healthy in her daughter and son; Heart disease in her mother; Hypertension in her mother; Mental illness in her son; Seizures in her brother; Seizures (age of onset: 58) in her brother.    No Known Allergies  Previous Medications   ATORVASTATIN (LIPITOR) 10 MG TABLET    Take 1 tablet (10 mg total) by mouth daily.   ERGOCALCIFEROL (VITAMIN D2)  2000 UNITS TABS    Take 1 tablet by mouth daily.   ESCITALOPRAM (LEXAPRO) 10 MG TABLET    Take 1 tablet (10 mg total) by mouth daily.   PANTOPRAZOLE (PROTONIX) 40 MG TABLET    Take 1 tablet (40 mg total) by mouth daily.   TRAZODONE (DESYREL) 50 MG TABLET    Take 1 tablet by mouth at bedtime.    Patient Care Team: Mar Daring, PA-C as PCP - General (Physician Assistant)     Objective:   Vitals: BP 160/90 mmHg  Pulse 73  Temp(Src) 97.8 F (36.6 C) (Oral)  Resp 16  Wt 135 lb 6.4 oz (61.417 kg)   Physical Exam  Constitutional: She is oriented to person, place, and time. She  appears well-developed and well-nourished. No distress.  HENT:  Head: Normocephalic and atraumatic.  Right Ear: Hearing, tympanic membrane, external ear and ear canal normal.  Left Ear: Hearing, tympanic membrane, external ear and ear canal normal.  Nose: Nose normal.  Mouth/Throat: Uvula is midline, oropharynx is clear and moist and mucous membranes are normal. No oropharyngeal exudate.  Eyes: Conjunctivae and EOM are normal. Pupils are equal, round, and reactive to light. Right eye exhibits no discharge. Left eye exhibits no discharge. No scleral icterus.  Neck: Normal range of motion. Neck supple. No JVD present. Carotid bruit is not present. No tracheal deviation present. No thyromegaly present.  Cardiovascular: Normal rate, regular rhythm, normal heart sounds and intact distal pulses.  Exam reveals no gallop and no friction rub.   No murmur heard. Pulmonary/Chest: Effort normal and breath sounds normal. No respiratory distress. She has no wheezes. She has no rales. She exhibits no tenderness. Right breast exhibits no inverted nipple, no mass, no nipple discharge, no skin change and no tenderness. Left breast exhibits no inverted nipple, no mass, no nipple discharge, no skin change and no tenderness. Breasts are symmetrical.  Abdominal: Soft. Bowel sounds are normal. She exhibits no distension and no mass. There is no tenderness. There is no rebound and no guarding. Hernia confirmed negative in the right inguinal area and confirmed negative in the left inguinal area.  Genitourinary: Rectum normal and uterus normal. No breast swelling, tenderness, discharge or bleeding. Pelvic exam was performed with patient supine. There is no rash, tenderness, lesion or injury on the right labia. There is no rash, tenderness, lesion or injury on the left labia. Cervix exhibits no motion tenderness, no discharge and no friability. Right adnexum displays no mass, no tenderness and no fullness. Left adnexum displays  no mass, no tenderness and no fullness. No erythema, tenderness or bleeding in the vagina. No signs of injury around the vagina. Vaginal discharge (white, milky) found.  Musculoskeletal: Normal range of motion. She exhibits no edema or tenderness.  Lymphadenopathy:    She has no cervical adenopathy.       Right: No inguinal adenopathy present.       Left: No inguinal adenopathy present.  Neurological: She is alert and oriented to person, place, and time. She has normal reflexes. No cranial nerve deficit. Coordination normal.  Skin: Skin is warm and dry. No rash noted. She is not diaphoretic.  Psychiatric: She has a normal mood and affect. Her behavior is normal. Judgment and thought content normal.  Vitals reviewed.    Depression Screen PHQ 2/9 Scores 09/17/2014  PHQ - 2 Score 4  PHQ- 9 Score 8      Assessment & Plan:     Routine Health Maintenance  and Physical Exam  1. Annual physical exam Physical exam today was within normal limits with the exception of the vaginal discharge noted below. Labs were done in December 2016 and were within normal limits and stable. I will see her back in one year for her repeat annual physical exam. She is to call the office if she has any acute issues, questions or concerns in the meantime.  2. Breast cancer screening Breast exam today was normal. There is no family history of breast cancer. Mammogram was ordered as below and she was given a card for [facility] breast clinic so that she may call and schedule her mammogram at her convenience. - MM DIGITAL SCREENING BILATERAL; Future  3. Colon cancer screening Previous colonoscopy was done in 2011 and showed internal hemorrhoids. She was to follow-up in 5 years. Referral for gastroenterology for colonoscopy was ordered as below. - Ambulatory referral to Gastroenterology  4. Cervical cancer screening Pap was collected today. Previous Pap smears have been within normal limits. She does have a positive  family history of cervical cancer in her mother. I will follow-up with her pending these results. - Pap IG w/ reflex to HPV when [facility] ([facility])  5. Vaginal discharge Wet prep done in the office today revealed many clue cells.  Will treat for BV as noted below. She is to call the office if symptoms fail to improve or worsen. - POCT Wet Prep [employer])  6. Bacterial vaginosis See above medical treatment plan. - metroNIDAZOLE (FLAGYL) 500 MG tablet; Take 1 tablet (500 mg total) by mouth 2 (two) times daily.  Dispense: 14 tablet; Refill: 0   Exercise Activities and Dietary recommendations Goals    None      Immunization History  Administered Date(s) Administered  . Influenza,inj,Quad PF,36+ Mos 03/14/2015  . Td 05/29/2004  . Tdap 11/28/2009    Health Maintenance  Topic Date Due  . Hepatitis C Screening  18-Mar-1956  . HIV Screening  02/24/1973  . PAP SMEAR  02/25/1979  . MAMMOGRAM  02/25/2008  . COLONOSCOPY  02/25/2008  . INFLUENZA VACCINE  10/29/2015  . TETANUS/TDAP  11/29/2019      Discussed health benefits of physical activity, and encouraged her to engage in regular exercise appropriate for her age and condition.    --------------------------------------------------------------------

## 2015-04-12 ENCOUNTER — Telehealth: Payer: Self-pay

## 2015-04-12 ENCOUNTER — Ambulatory Visit
Admission: RE | Admit: 2015-04-12 | Discharge: 2015-04-12 | Disposition: A | Discharge status: Home / Self Care | Payer: Managed Care, Other (non HMO) | Source: Ambulatory Visit | Attending: Physician Assistant | Admitting: Physician Assistant

## 2015-04-12 ENCOUNTER — Other Ambulatory Visit: Payer: Self-pay

## 2015-04-12 DIAGNOSIS — Z1239 Encounter for other screening for malignant neoplasm of breast: Secondary | ICD-10-CM

## 2015-04-12 DIAGNOSIS — Z1231 Encounter for screening mammogram for malignant neoplasm of breast: Secondary | ICD-10-CM | POA: Insufficient documentation

## 2015-04-12 NOTE — Telephone Encounter (Signed)
-----   Message from Mar Daring, [place] sent at 04/12/2015 11:04 AM EST ----- Normal mammogram. Repeat screening in one year.

## 2015-04-12 NOTE — Telephone Encounter (Signed)
Gastroenterology Pre-Procedure Review  Request Date:  Requesting Physician: Fenton Malling, PA  PATIENT REVIEW QUESTIONS: The patient responded to the following health history questions as indicated:    1. Are you having any GI issues? no 2. Do you have a personal history of Polyps? no 3. Do you have a family history of Colon Cancer or Polyps? no 4. Diabetes Mellitus? no 5. Joint replacements in the past 12 months?no 6. Major health problems in the past 3 months?no 7. Any artificial heart valves, MVP, or defibrillator?no    MEDICATIONS & ALLERGIES:    Patient reports the following regarding taking any anticoagulation/antiplatelet therapy:   Plavix, Coumadin, Eliquis, Xarelto, Lovenox, Pradaxa, Brilinta, or Effient? no Aspirin? yes (ASA 81mg )  Patient confirms/reports the following medications:  Current Outpatient Prescriptions  Medication Sig Dispense Refill  . atorvastatin (LIPITOR) 10 MG tablet Take 1 tablet (10 mg total) by mouth daily. 90 tablet 3  . Ergocalciferol (VITAMIN D2) 2000 UNITS TABS Take 1 tablet by mouth daily.    Marland Kitchen escitalopram (LEXAPRO) 10 MG tablet Take 1 tablet (10 mg total) by mouth daily. 90 tablet 3  . metroNIDAZOLE (FLAGYL) 500 MG tablet Take 1 tablet (500 mg total) by mouth 2 (two) times daily. 14 tablet 0  . pantoprazole (PROTONIX) 40 MG tablet Take 1 tablet (40 mg total) by mouth daily. 90 tablet 3  . traZODone (DESYREL) 50 MG tablet Take 1 tablet by mouth at bedtime.     No current facility-administered medications for this visit.    Patient confirms/reports the following allergies:  No Known Allergies  No orders of the defined types were placed in this encounter.    AUTHORIZATION INFORMATION Primary Insurance: 1D#: Group #:  Secondary Insurance: 1D#: Group #:  SCHEDULE INFORMATION: Date: 04/18/15 Time: Location: [place]

## 2015-04-12 NOTE — Telephone Encounter (Signed)
Patient advised as directed below.  Thanks,  -Joseline 

## 2015-04-15 ENCOUNTER — Encounter: Payer: Self-pay | Admitting: *Deleted

## 2015-04-16 NOTE — Discharge Instructions (Signed)

## 2015-04-17 ENCOUNTER — Other Ambulatory Visit: Payer: Self-pay

## 2015-04-17 DIAGNOSIS — Z1211 Encounter for screening for malignant neoplasm of colon: Secondary | ICD-10-CM

## 2015-04-17 LAB — PAP IG W/ RFLX HPV ASCU: PAP Smear Comment: 0

## 2015-04-17 MED ORDER — PEG 3350-KCL-NA BICARB-NACL 420 G PO SOLR: 4000.0000 mL | ORAL | Status: DC

## 2015-04-18 ENCOUNTER — Ambulatory Visit
Admission: RE | Admit: 2015-04-18 | Discharge: 2015-04-18 | Disposition: A | Discharge status: Home / Self Care | Payer: Managed Care, Other (non HMO) | Source: Ambulatory Visit | Attending: Gastroenterology | Admitting: Gastroenterology

## 2015-04-18 ENCOUNTER — Encounter
Admission: RE | Disposition: A | Discharge status: Home / Self Care | Payer: Self-pay | Source: Ambulatory Visit | Attending: Gastroenterology

## 2015-04-18 ENCOUNTER — Other Ambulatory Visit: Payer: Self-pay | Admitting: Gastroenterology

## 2015-04-18 ENCOUNTER — Ambulatory Visit: Payer: Managed Care, Other (non HMO) | Admitting: Anesthesiology

## 2015-04-18 DIAGNOSIS — F419 Anxiety disorder, unspecified: Secondary | ICD-10-CM | POA: Insufficient documentation

## 2015-04-18 DIAGNOSIS — Z8049 Family history of malignant neoplasm of other genital organs: Secondary | ICD-10-CM | POA: Insufficient documentation

## 2015-04-18 DIAGNOSIS — E785 Hyperlipidemia, unspecified: Secondary | ICD-10-CM | POA: Insufficient documentation

## 2015-04-18 DIAGNOSIS — Z7982 Long term (current) use of aspirin: Secondary | ICD-10-CM | POA: Diagnosis not present

## 2015-04-18 DIAGNOSIS — F329 Major depressive disorder, single episode, unspecified: Secondary | ICD-10-CM | POA: Insufficient documentation

## 2015-04-18 DIAGNOSIS — Z833 Family history of diabetes mellitus: Secondary | ICD-10-CM | POA: Insufficient documentation

## 2015-04-18 DIAGNOSIS — K219 Gastro-esophageal reflux disease without esophagitis: Secondary | ICD-10-CM | POA: Diagnosis not present

## 2015-04-18 DIAGNOSIS — K640 First degree hemorrhoids: Secondary | ICD-10-CM | POA: Diagnosis not present

## 2015-04-18 DIAGNOSIS — Z1211 Encounter for screening for malignant neoplasm of colon: Secondary | ICD-10-CM | POA: Insufficient documentation

## 2015-04-18 DIAGNOSIS — Z818 Family history of other mental and behavioral disorders: Secondary | ICD-10-CM | POA: Insufficient documentation

## 2015-04-18 DIAGNOSIS — Z8249 Family history of ischemic heart disease and other diseases of the circulatory system: Secondary | ICD-10-CM | POA: Insufficient documentation

## 2015-04-18 DIAGNOSIS — Z820 Family history of epilepsy and other diseases of the nervous system: Secondary | ICD-10-CM | POA: Diagnosis not present

## 2015-04-18 DIAGNOSIS — D125 Benign neoplasm of sigmoid colon: Secondary | ICD-10-CM | POA: Diagnosis not present

## 2015-04-18 DIAGNOSIS — Z79899 Other long term (current) drug therapy: Secondary | ICD-10-CM | POA: Diagnosis not present

## 2015-04-18 HISTORY — PX: POLYPECTOMY: SHX5525

## 2015-04-18 HISTORY — DX: Motion sickness, initial encounter: T75.3XXA

## 2015-04-18 HISTORY — PX: COLONOSCOPY WITH PROPOFOL: SHX5780

## 2015-04-18 SURGERY — COLONOSCOPY WITH PROPOFOL
Anesthesia: Monitor Anesthesia Care | Wound class: Contaminated

## 2015-04-18 MED ORDER — OXYCODONE HCL 5 MG/5ML PO SOLN: 5.0000 mg | Freq: Once | ORAL | Status: DC | PRN

## 2015-04-18 MED ORDER — OXYCODONE HCL 5 MG PO TABS
5.0000 mg | ORAL_TABLET | Freq: Once | ORAL | Status: DC | PRN
Start: 2015-04-18 — End: 2015-04-18

## 2015-04-18 MED ORDER — LIDOCAINE HCL (CARDIAC) 20 MG/ML IV SOLN
INTRAVENOUS | Status: DC | PRN
  Administered 2015-04-18: 50 mg via INTRAVENOUS

## 2015-04-18 MED ORDER — MEPERIDINE HCL 25 MG/ML IJ SOLN: 6.2500 mg | INTRAMUSCULAR | Status: DC | PRN

## 2015-04-18 MED ORDER — LACTATED RINGERS IV SOLN
INTRAVENOUS | Status: DC
  Administered 2015-04-18: 10:00:00 via INTRAVENOUS

## 2015-04-18 MED ORDER — PROPOFOL 10 MG/ML IV BOLUS
INTRAVENOUS | Status: DC | PRN
  Administered 2015-04-18 (×2): 10 mg via INTRAVENOUS
  Administered 2015-04-18 (×4): 20 mg via INTRAVENOUS
  Administered 2015-04-18: 50 mg via INTRAVENOUS

## 2015-04-18 MED ORDER — HYDROMORPHONE HCL 1 MG/ML IJ SOLN: 0.2500 mg | INTRAMUSCULAR | Status: DC | PRN

## 2015-04-18 MED ORDER — PROMETHAZINE HCL 25 MG/ML IJ SOLN: 6.2500 mg | INTRAMUSCULAR | Status: DC | PRN

## 2015-04-18 MED ORDER — STERILE WATER FOR IRRIGATION IR SOLN
Status: DC | PRN
  Administered 2015-04-18: 11:00:00

## 2015-04-18 SURGICAL SUPPLY — 28 items
CANISTER SUCT 1200ML W/VALVE (MISCELLANEOUS) ×4 IMPLANT
FCP ESCP3.2XJMB 240X2.8X (MISCELLANEOUS)
FORCEPS BIOP RAD 4 LRG CAP 4 (CUTTING FORCEPS) ×4 IMPLANT
FORCEPS BIOP RJ4 240 W/NDL (MISCELLANEOUS)
FORCEPS ESCP3.2XJMB 240X2.8X (MISCELLANEOUS) IMPLANT
GOWN CVR UNV OPN BCK APRN NK (MISCELLANEOUS) ×4 IMPLANT
GOWN ISOL THUMB LOOP REG UNIV (MISCELLANEOUS) ×4
HEMOCLIP INSTINCT (CLIP) IMPLANT
INJECTOR VARIJECT VIN23 (MISCELLANEOUS) IMPLANT
KIT CO2 TUBING (TUBING) IMPLANT
KIT DEFENDO VALVE AND CONN (KITS) IMPLANT
KIT ENDO PROCEDURE OLY (KITS) ×4 IMPLANT
LIGATOR MULTIBAND 6SHOOTER MBL (MISCELLANEOUS) IMPLANT
MARKER SPOT ENDO TATTOO 5ML (MISCELLANEOUS) IMPLANT
PAD GROUND ADULT SPLIT (MISCELLANEOUS) IMPLANT
SNARE SHORT THROW 13M SML OVAL (MISCELLANEOUS) IMPLANT
SNARE SHORT THROW 30M LRG OVAL (MISCELLANEOUS) IMPLANT
SPOT EX ENDOSCOPIC TATTOO (MISCELLANEOUS)
SUCTION POLY TRAP 4CHAMBER (MISCELLANEOUS) IMPLANT
TRAP SUCTION POLY (MISCELLANEOUS) IMPLANT
TUBING CONN 6MMX3.1M (TUBING)
TUBING SUCTION CONN 0.25 STRL (TUBING) IMPLANT
UNDERPAD 30X60 958B10 (PK) (MISCELLANEOUS) IMPLANT
VALVE BIOPSY ENDO (VALVE) IMPLANT
VARIJECT INJECTOR VIN23 (MISCELLANEOUS)
WATER AUXILLARY (MISCELLANEOUS) IMPLANT
WATER STERILE IRR 250ML POUR (IV SOLUTION) ×4 IMPLANT
WATER STERILE IRR 500ML POUR (IV SOLUTION) IMPLANT

## 2015-04-18 NOTE — Op Note (Signed)
[facility] Gastroenterology Patient Name: Dawn Adkins Procedure Date: 04/18/2015 10:40 AM MRN: [id number] Account #: [id number] Date of Birth: 1956/01/03 Admit Type: Outpatient Age: 60 Room: [facility] OR ROOM 01 Gender: Female Note Status: Finalized Procedure:         Colonoscopy Indications:       Screening for colorectal malignant neoplasm Providers:         Lucilla Lame, MD Medicines:         Propofol per Anesthesia Complications:     No immediate complications. Procedure:         Pre-Anesthesia Assessment:                    - Prior to the procedure, a History and Physical was                     performed, and patient medications and allergies were                     reviewed. The patient's tolerance of previous anesthesia                     was also reviewed. The risks and benefits of the procedure                     and the sedation options and risks were discussed with the                     patient. All questions were answered, and informed consent                     was obtained. Prior Anticoagulants: The patient has taken                     no previous anticoagulant or antiplatelet agents. ASA                     Grade Assessment: II - A patient with mild systemic                     disease. After reviewing the risks and benefits, the                     patient was deemed in satisfactory condition to undergo                     the procedure.                    After obtaining informed consent, the colonoscope was                     passed under direct vision. Throughout the procedure, the                     patient's blood pressure, pulse, and oxygen saturations                     were monitored continuously. The Olympus CF H180AL                     colonoscope (S#: [id number]) was introduced through the anus                     and advanced to the the cecum, identified by  appendiceal                     orifice and ileocecal valve. The colonoscopy  was performed                     without difficulty. The patient tolerated the procedure                     well. The quality of the bowel preparation was excellent. Findings:      The perianal and digital rectal examinations were normal.      Three sessile polyps were found in the sigmoid colon. The polyps were 2       to 3 mm in size. These polyps were removed with a cold biopsy forceps.       Resection and retrieval were complete.      Non-bleeding internal hemorrhoids were found during retroflexion. The       hemorrhoids were Grade I (internal hemorrhoids that do not prolapse). Impression:        - Three 2 to 3 mm polyps in the sigmoid colon. Resected                     and retrieved.                    - Non-bleeding internal hemorrhoids. Recommendation:    - Repeat colonoscopy in 5 years if polyp adenoma and 10                     years if hyperplastic Procedure Code(s): --- Professional ---                    [id number], Colonoscopy, flexible; with biopsy, single or                     multiple Diagnosis Code(s): --- Professional ---                    Z12.11, Encounter for screening for malignant neoplasm of                     colon                    D12.5, Benign neoplasm of sigmoid colon CPT copyright 2014 American [employer]. All rights reserved. The codes documented in this report are preliminary and upon coder review may  be revised to meet current compliance requirements. Lucilla Lame, MD 04/18/2015 10:55:47 AM This report has been signed electronically. Number of Addenda: 0 Note Initiated On: 04/18/2015 10:40 AM Scope Withdrawal Time: 0 hours 7 minutes 4 seconds  Total Procedure Duration: 0 hours 10 minutes 8 seconds       [facility]

## 2015-04-18 NOTE — Anesthesia Postprocedure Evaluation (Signed)
Anesthesia Post Note  Patient: Dawn Adkins  Procedure(s) Performed: Procedure(s) (LRB): COLONOSCOPY WITH PROPOFOL (N/A) POLYPECTOMY  Patient location during evaluation: PACU Anesthesia Type: MAC Level of consciousness: awake and alert Pain management: pain level controlled Vital Signs Assessment: post-procedure vital signs reviewed and stable Respiratory status: spontaneous breathing Cardiovascular status: stable Postop Assessment: no signs of nausea or vomiting and adequate PO intake Anesthetic complications: no    Estill Batten

## 2015-04-18 NOTE — Anesthesia Procedure Notes (Signed)
Procedure Name: MAC Performed by: Shatima Zalar Pre-anesthesia Checklist: Patient identified, Emergency Drugs available, Suction available, Timeout performed and Patient being monitored Patient Re-evaluated:Patient Re-evaluated prior to inductionOxygen Delivery Method: Nasal cannula Placement Confirmation: positive ETCO2       

## 2015-04-18 NOTE — Transfer of Care (Signed)
Immediate Anesthesia Transfer of Care Note  Patient: Dawn Adkins  Procedure(s) Performed: Procedure(s): COLONOSCOPY WITH PROPOFOL (N/A) POLYPECTOMY  Patient Location: PACU  Anesthesia Type: MAC  Level of Consciousness: awake, alert  and patient cooperative  Airway and Oxygen Therapy: Patient Spontanous Breathing and Patient connected to supplemental oxygen  Post-op Assessment: Post-op Vital signs reviewed, Patient's Cardiovascular Status Stable, Respiratory Function Stable, Patent Airway and No signs of Nausea or vomiting  Post-op Vital Signs: Reviewed and stable  Complications: No apparent anesthesia complications

## 2015-04-18 NOTE — Anesthesia Preprocedure Evaluation (Addendum)
Anesthesia Evaluation  Patient identified by MRN, date of birth, ID band Patient awake    Reviewed: Allergy & Precautions, NPO status , Patient's Chart, lab work & pertinent test results, reviewed documented beta blocker date and time   Airway Mallampati: I  TM Distance: >3 FB Neck ROM: Full    Dental no notable dental hx.    Pulmonary neg pulmonary ROS,    Pulmonary exam normal        Cardiovascular negative cardio ROS Normal cardiovascular exam     Neuro/Psych PSYCHIATRIC DISORDERS Anxiety Depression negative neurological ROS     GI/Hepatic Neg liver ROS, GERD  Medicated and Controlled,  Endo/Other  negative endocrine ROS  Renal/GU negative Renal ROS     Musculoskeletal   Abdominal   Peds  Hematology negative hematology ROS (+)   Anesthesia Other Findings   Reproductive/Obstetrics                            Anesthesia Physical Anesthesia Plan  ASA: II  Anesthesia Plan: MAC   Post-op Pain Management:    Induction: Intravenous  Airway Management Planned:   Additional Equipment:   Intra-op Plan:   Post-operative Plan:   Informed Consent: I have reviewed the patients History and Physical, chart, labs and discussed the procedure including the risks, benefits and alternatives for the proposed anesthesia with the patient or authorized representative who has indicated his/her understanding and acceptance.     Plan Discussed with: CRNA  Anesthesia Plan Comments:         Anesthesia Quick Evaluation

## 2015-04-18 NOTE — H&P (Signed)
[facility]  [address], [place], [place] [address] Phone: [phone] Fax : [phone]  Primary Care Physician:  Mar Daring, PA-C Primary Gastroenterologist:  Dr. Allen Norris  Pre-Procedure History & Physical: HPI:  Dawn Adkins is a 60 y.o. female is here for a screening colonoscopy.   Past Medical History  Diagnosis Date  . GERD (gastroesophageal reflux disease)   . Depression   . Anxiety   . Hyperlipidemia   . Motion sickness     back seat - car    Past Surgical History  Procedure Laterality Date  . Tubal ligation  1984  . Colonoscopy  2011    Prior to Admission medications   Medication Sig Start Date End Date Taking? Authorizing Provider  aspirin 81 MG tablet Take 81 mg by mouth daily.   Yes Historical Provider, MD  atorvastatin (LIPITOR) 10 MG tablet Take 1 tablet (10 mg total) by mouth daily. 11/07/14  Yes Mar Daring, PA-C  Ergocalciferol (VITAMIN D2) 2000 UNITS TABS Take 1 tablet by mouth daily.   Yes Historical Provider, MD  escitalopram (LEXAPRO) 10 MG tablet Take 1 tablet (10 mg total) by mouth daily. 09/17/14  Yes Clearnce Sorrel Burnette, PA-C  metroNIDAZOLE (FLAGYL) 500 MG tablet Take 1 tablet (500 mg total) by mouth 2 (two) times daily. 04/11/15  Yes Clearnce Sorrel Burnette, PA-C  pantoprazole (PROTONIX) 40 MG tablet Take 1 tablet (40 mg total) by mouth daily. 09/17/14  Yes Clearnce Sorrel Burnette, PA-C  polyethylene glycol-electrolytes (TRILYTE) 420 g solution Take 4,000 mLs by mouth as directed. Drink one 8 oz glass every 30 mins until stools run clear. 04/17/15  Yes Lucilla Lame, MD  traZODone (DESYREL) 50 MG tablet Take 1 tablet by mouth at bedtime. 06/02/14  Yes Historical Provider, MD    Allergies as of 04/12/2015  . (No Known Allergies)    Family History  Problem Relation Age of Onset  . Cervical cancer Mother   . Hypertension Mother   . Heart disease Mother   . Diabetes Son   . Mental illness Son   . Seizures Brother 34      fell from a 2 story building  . Seizures Brother   . Healthy Son   . Healthy Daughter     Social History   Social History  . Marital Status: Married    Spouse Name: Celvin  . Number of Children: 3  . Years of Education: N/A   Occupational History  . part itme    Social History Main Topics  . Smoking status: Never Smoker   . Smokeless tobacco: Never Used  . Alcohol Use: 2.4 oz/week    0 Standard drinks or equivalent, 4 Cans of beer per week     Comment: OCCASIONALLY  . Drug Use: No  . Sexual Activity: Not on file   Other Topics Concern  . Not on file   Social History Narrative    Review of Systems: See HPI, otherwise negative ROS  Physical Exam: BP 137/88 mmHg  Pulse 63  Temp(Src) 98.1 F (36.7 C) (Temporal)  Resp 16  Ht 4\' 11"  (1.499 m)  Wt 129 lb (58.514 kg)  BMI 26.04 kg/m2  SpO2 100% General:   Alert,  pleasant and cooperative in NAD Head:  Normocephalic and atraumatic. Neck:  Supple; no masses or thyromegaly. Lungs:  Clear throughout to auscultation.    Heart:  Regular rate and rhythm. Abdomen:  Soft, nontender and nondistended. Normal bowel sounds, without guarding,  and without rebound.   Neurologic:  Alert and  oriented x4;  grossly normal neurologically.  Impression/Plan: Dawn Adkins is now here to undergo a screening colonoscopy.  Risks, benefits, and alternatives regarding colonoscopy have been reviewed with the patient.  Questions have been answered.  All parties agreeable.

## 2015-04-19 ENCOUNTER — Encounter: Payer: Self-pay | Admitting: Gastroenterology

## 2015-04-22 ENCOUNTER — Encounter: Payer: Self-pay | Admitting: Gastroenterology

## 2015-07-05 ENCOUNTER — Other Ambulatory Visit: Payer: Self-pay | Admitting: Family Medicine

## 2015-07-05 DIAGNOSIS — G4700 Insomnia, unspecified: Secondary | ICD-10-CM

## 2015-09-02 ENCOUNTER — Other Ambulatory Visit: Payer: Self-pay | Admitting: Physician Assistant

## 2015-09-02 DIAGNOSIS — K2100 Gastro-esophageal reflux disease with esophagitis, without bleeding: Secondary | ICD-10-CM

## 2015-09-02 MED ORDER — PANTOPRAZOLE SODIUM 40 MG PO TBEC: 40.0000 mg | DELAYED_RELEASE_TABLET | Freq: Every day | ORAL | Status: DC

## 2015-10-07 ENCOUNTER — Other Ambulatory Visit: Payer: Self-pay

## 2015-10-07 DIAGNOSIS — F419 Anxiety disorder, unspecified: Secondary | ICD-10-CM

## 2015-10-07 MED ORDER — ESCITALOPRAM OXALATE 10 MG PO TABS: 10.0000 mg | ORAL_TABLET | Freq: Every day | ORAL | Status: DC

## 2015-10-07 NOTE — Telephone Encounter (Signed)
Pharmacy requesting refills. Thanks!  

## 2015-10-08 ENCOUNTER — Other Ambulatory Visit: Payer: Self-pay

## 2015-10-08 DIAGNOSIS — F419 Anxiety disorder, unspecified: Secondary | ICD-10-CM

## 2015-10-08 NOTE — Telephone Encounter (Signed)
Refill request from [facility] [phone] phone Fax [phone]

## 2015-11-11 ENCOUNTER — Other Ambulatory Visit: Payer: Self-pay

## 2015-11-11 DIAGNOSIS — E7800 Pure hypercholesterolemia, unspecified: Secondary | ICD-10-CM

## 2015-11-11 MED ORDER — ATORVASTATIN CALCIUM 10 MG PO TABS: 10.0000 mg | ORAL_TABLET | Freq: Every day | ORAL | 1 refills | Status: DC

## 2015-11-11 NOTE — Telephone Encounter (Signed)
Pharmacy requesting refill.

## 2016-02-03 ENCOUNTER — Encounter: Payer: Self-pay | Admitting: Physician Assistant

## 2016-02-03 ENCOUNTER — Ambulatory Visit (INDEPENDENT_AMBULATORY_CARE_PROVIDER_SITE_OTHER): Payer: Managed Care, Other (non HMO) | Admitting: Physician Assistant

## 2016-02-03 VITALS — BP 122/90 | HR 62 | Temp 98.2°F | Resp 16 | Wt 132.2 lb

## 2016-02-03 DIAGNOSIS — H1032 Unspecified acute conjunctivitis, left eye: Secondary | ICD-10-CM

## 2016-02-03 MED ORDER — POLYMYXIN B-TRIMETHOPRIM 10000-0.1 UNIT/ML-% OP SOLN: 1.0000 [drp] | OPHTHALMIC | 0 refills | Status: DC

## 2016-02-03 NOTE — Progress Notes (Signed)
Patient: Dawn Adkins Female    DOB: [birth year]   60 y.o.   MRN: [id number] Visit Date: 02/03/2016  Today's Provider: Mar Daring, PA-C   Chief Complaint  Patient presents with  . Conjunctivitis   Subjective:    Conjunctivitis   The current episode started 3 to 5 days ago. The onset was sudden. The problem has been gradually worsening. The problem is moderate. Nothing relieves the symptoms. Associated symptoms include eye itching, rhinorrhea, sore throat, eye discharge and eye redness. Pertinent negatives include no fever, no congestion, no ear discharge, no ear pain, no headaches, no cough, no URI and no wheezing. The left eye is affected. The eye pain is not associated with movement. The eyelid exhibits swelling and redness.       No Known Allergies   Current Outpatient Prescriptions:  .  aspirin 81 MG tablet, Take 81 mg by mouth daily., Disp: , Rfl:  .  Ergocalciferol (VITAMIN D2) 2000 UNITS TABS, Take 1 tablet by mouth daily., Disp: , Rfl:  .  escitalopram (LEXAPRO) 10 MG tablet, Take 1 tablet (10 mg total) by mouth daily., Disp: 90 tablet, Rfl: 3 .  pantoprazole (PROTONIX) 40 MG tablet, Take 1 tablet (40 mg total) by mouth daily., Disp: 30 tablet, Rfl: 11 .  polyethylene glycol-electrolytes (TRILYTE) 420 g solution, Take 4,000 mLs by mouth as directed. Drink one 8 oz glass every 30 mins until stools run clear., Disp: 4000 mL, Rfl: 0 .  traZODone (DESYREL) 50 MG tablet, TAKE 1 TABLET BY MOUTH ONCE DAILY., Disp: 30 tablet, Rfl: 5 .  atorvastatin (LIPITOR) 10 MG tablet, Take 1 tablet (10 mg total) by mouth daily. (Patient not taking: Reported on 02/03/2016), Disp: 90 tablet, Rfl: 1 .  metroNIDAZOLE (FLAGYL) 500 MG tablet, Take 1 tablet (500 mg total) by mouth 2 (two) times daily. (Patient not taking: Reported on 02/03/2016), Disp: 14 tablet, Rfl: 0  Review of Systems  Constitutional: Negative for fever.  HENT: Positive for rhinorrhea and sore throat. Negative  for congestion, ear discharge, ear pain, sinus pain, sinus pressure and trouble swallowing.   Eyes: Positive for discharge, redness, itching and visual disturbance (blurred).  Respiratory: Negative for cough and wheezing.   Neurological: Negative for headaches.    Social History  Substance Use Topics  . Smoking status: Never Smoker  . Smokeless tobacco: Never Used  . Alcohol use 2.4 oz/week    4 Cans of beer per week     Comment: OCCASIONALLY   Objective:   BP 122/90 (BP Location: Right Arm, Patient Position: Sitting, Cuff Size: Normal)   Pulse 62   Temp 98.2 F (36.8 C) (Oral)   Resp 16   Wt 132 lb 3.2 oz (60 kg)   BMI 26.70 kg/m   Physical Exam  Constitutional: She appears well-developed and well-nourished. No distress.  HENT:  Head: Normocephalic and atraumatic.  Right Ear: Hearing, tympanic membrane, external ear and ear canal normal.  Left Ear: Hearing, tympanic membrane, external ear and ear canal normal.  Nose: No mucosal edema or rhinorrhea. Right sinus exhibits no maxillary sinus tenderness and no frontal sinus tenderness. Left sinus exhibits no maxillary sinus tenderness and no frontal sinus tenderness.  Mouth/Throat: Uvula is midline, oropharynx is clear and moist and mucous membranes are normal. No oropharyngeal exudate, posterior oropharyngeal edema or posterior oropharyngeal erythema.  Eyes: EOM are normal. Pupils are equal, round, and reactive to light. Right eye exhibits no chemosis, no discharge and  no exudate. No foreign body present in the right eye. Left eye exhibits discharge and exudate. Left eye exhibits no chemosis. No foreign body present in the left eye. Right conjunctiva is not injected. Right conjunctiva has no hemorrhage. Left conjunctiva is injected. Left conjunctiva has no hemorrhage.  Neck: Normal range of motion. Neck supple. No tracheal deviation present. No thyromegaly present.  Cardiovascular: Normal rate, regular rhythm and normal heart sounds.   Exam reveals no gallop and no friction rub.   No murmur heard. Pulmonary/Chest: Effort normal and breath sounds normal. No stridor. No respiratory distress. She has no wheezes. She has no rales.  Lymphadenopathy:    She has no cervical adenopathy.  Skin: She is not diaphoretic.  Vitals reviewed.      Assessment & Plan:     1. Acute bacterial conjunctivitis of left eye Worsening symptoms. Will treat with polytrim drops as below. Advised patient to continue with warm compresses, cleaning eye from inner corner to outer, and good hand hygiene. She is to call if symptoms worsen or if she develops visual changes such as double vision or pain.  - trimethoprim-polymyxin b (POLYTRIM) ophthalmic solution; Place 1 drop into the left eye every 4 (four) hours.  Dispense: 10 mL; Refill: 0       Mar Daring, PA-C  [facility]

## 2016-02-03 NOTE — Patient Instructions (Signed)

## 2016-04-13 ENCOUNTER — Ambulatory Visit (INDEPENDENT_AMBULATORY_CARE_PROVIDER_SITE_OTHER): Payer: Managed Care, Other (non HMO) | Admitting: Physician Assistant

## 2016-04-13 ENCOUNTER — Encounter: Payer: Self-pay | Admitting: Physician Assistant

## 2016-04-13 VITALS — BP 160/100 | HR 56 | Temp 97.6°F | Resp 16 | Ht 59.0 in | Wt 134.0 lb

## 2016-04-13 DIAGNOSIS — Z833 Family history of diabetes mellitus: Secondary | ICD-10-CM

## 2016-04-13 DIAGNOSIS — I7300 Raynaud's syndrome without gangrene: Secondary | ICD-10-CM

## 2016-04-13 DIAGNOSIS — Z0000 Encounter for general adult medical examination without abnormal findings: Secondary | ICD-10-CM

## 2016-04-13 DIAGNOSIS — K219 Gastro-esophageal reflux disease without esophagitis: Secondary | ICD-10-CM

## 2016-04-13 DIAGNOSIS — F419 Anxiety disorder, unspecified: Secondary | ICD-10-CM

## 2016-04-13 DIAGNOSIS — E7800 Pure hypercholesterolemia, unspecified: Secondary | ICD-10-CM | POA: Diagnosis not present

## 2016-04-13 DIAGNOSIS — Z1231 Encounter for screening mammogram for malignant neoplasm of breast: Secondary | ICD-10-CM

## 2016-04-13 DIAGNOSIS — Z1159 Encounter for screening for other viral diseases: Secondary | ICD-10-CM

## 2016-04-13 DIAGNOSIS — Z1239 Encounter for other screening for malignant neoplasm of breast: Secondary | ICD-10-CM

## 2016-04-13 DIAGNOSIS — Z114 Encounter for screening for human immunodeficiency virus [HIV]: Secondary | ICD-10-CM

## 2016-04-13 MED ORDER — NIFEDIPINE ER OSMOTIC RELEASE 30 MG PO TB24: 30.0000 mg | ORAL_TABLET | Freq: Every day | ORAL | 1 refills | Status: DC

## 2016-04-13 MED ORDER — ESCITALOPRAM OXALATE 10 MG PO TABS: 10.0000 mg | ORAL_TABLET | Freq: Every day | ORAL | 3 refills | Status: DC

## 2016-04-13 NOTE — Patient Instructions (Signed)
Raynaud Phenomenon Raynaud phenomenon is a condition that affects the blood vessels (arteries) that carry blood to your fingers and toes. The arteries that supply blood to your ears or the tip of your nose might also be affected. Raynaud phenomenon causes the arteries to temporarily narrow. As a result, the flow of blood to the affected areas is temporarily decreased. This usually occurs in response to cold temperatures or stress. During an attack, the skin in the affected areas turns white. You may also feel tingling or numbness in those areas. Attacks usually last for only a brief period, and then the blood flow to the area returns to normal. In most cases, Raynaud phenomenon does not cause serious health problems. CAUSES  For many people with this condition, the cause is not known. Raynaud phenomenon is sometimes associated with other diseases, such as scleroderma or lupus. RISK FACTORS Raynaud phenomenon can affect anyone, but it develops most often in people who are 65-60 years old. It affects more females than males. SIGNS AND SYMPTOMS Symptoms of Raynaud phenomenon may occur when you are exposed to cold temperatures or when you have emotional stress. The symptoms may last for a few minutes or up to several hours. They usually affect your fingers but may also affect your toes, ears, or the tip of your nose. Symptoms may include:  Changes in skin color. The skin in the affected areas will turn pale or white. The skin may then change from white to bluish to red as normal blood flow returns to the area.  Numbness, tingling, or pain in the affected areas. In severe cases, sores may develop in the affected areas.  DIAGNOSIS  Your health care provider will do a physical exam and take your medical history. You may be asked to put your hands in cold water to check for a reaction to cold temperature. Blood tests may be done to check for other diseases or conditions. Your health care provider may also  order a test to check the movement of blood through your arteries and veins (vascular ultrasound). TREATMENT  Treatment often involves making lifestyle changes and taking steps to control your exposure to cold temperatures. For more severe cases, medicine (calcium channel blockers) may be used to improve blood flow. Surgery is sometimes done to block the nerves that control the affected arteries, but this is rare. HOME CARE INSTRUCTIONS   Avoid exposure to cold by taking these steps:  If possible, stay indoors during cold weather.  When you go outside during cold weather, dress in layers and wear mittens, a hat, a scarf, and warm footwear.  Wear mittens or gloves when handling ice or frozen food.  Use holders for glasses or cans containing cold drinks.  Let warm water run for a while before taking a shower or bath.  Warm up the car before driving in cold weather.  If possible, avoid stressful and emotional situations. Exercise, meditation, and yoga may help you cope with stress. Biofeedback may be useful.  Do not use any tobacco products, including cigarettes, chewing tobacco, or electronic cigarettes. If you need help quitting, ask your health care provider.  Avoid secondhand smoke.  Limit your use of caffeine. Switch to decaffeinated coffee, tea, and soda. Avoid chocolate.  Wear loose fitting socks and comfortable, roomy shoes.  Avoid vibrating tools and machinery.  Take medicines only as directed by your health care provider. SEEK MEDICAL CARE IF:   Your discomfort becomes worse despite lifestyle changes.  You develop sores on  your fingers or toes that do not heal.  Your fingers or toes turn black.  You have breaks in the skin on your fingers or toes.  You have a fever.  You have pain or swelling in your joints.  You have a rash.  Your symptoms occur on only one side of your body. This information is not intended to replace advice given to you by your health care  provider. Make sure you discuss any questions you have with your health care provider. Document Released: 03/13/2000 Document Revised: 04/06/2014 Document Reviewed: 10/19/2013 Elsevier Interactive Patient Education  2017 [place]. Nifedipine extended-release tablets What is this medicine? NIFEDIPINE (nye FED i peen) is a calcium-channel blocker. It affects the amount of calcium found in your heart and muscle cells. This relaxes your blood vessels, which can reduce the amount of work the heart has to do. This medicine is used to treat high blood pressure and chest pain caused by angina. This medicine may be used for other purposes; ask your health care provider or pharmacist if you have questions. COMMON BRAND NAME(S): Adalat CC, Afeditab CR, Nifediac CC, Nifedical XL, Procardia XL What should I tell my health care provider before I take this medicine? They need to know if you have any of these conditions: -heart problems, low blood pressure, slow or irregular heartbeat -kidney disease -liver disease -previous heart attack -stomach or intestine problems -an unusual or allergic reaction to nifedipine, other medicines, foods, dyes, or preservatives -pregnant or trying to get pregnant -breast-feeding How should I use this medicine? Take this medicine by mouth with a glass of water. Follow the directions on the prescription label. Do not cut, crush or chew. Take your doses at regular intervals. Do not take your medicine more often then directed. Do not suddenly stop taking this medicine. Your doctor will tell you how much medicine to take. If your doctor wants you to stop the medicine, the dose will be slowly lowered over time to avoid any side effects. Talk to your pediatrician regarding the use of this medicine in children. Special care may be needed. Overdosage: If you think you have taken too much of this medicine contact a poison control center or emergency room at once. NOTE: This  medicine is only for you. Do not share this medicine with others. What if I miss a dose? If you miss a dose, take it as soon as you can. If it is almost time for your next dose, take only that dose. Do not take double or extra doses. What may interact with this medicine? Do not take this medicine with any of the following medications: -certain medicines for seizures like carbamazepine, phenobarbital, phenytoin -lumacaftor; ivacaftor -rifabutin -rifampin -rifapentine -St. John's Wort This medicine may also interact with the following medications: -antiviral medicines for HIV or AIDS -certain medicines for blood pressure -certain medicines for diabetes -certain medicines for erectile dysfunction -certain medicines for fungal infections like ketoconazole, fluconazole, and itraconazole -certain medicines for irregular heart beat like flecainide and quinidine -certain medicines that treat or prevent blood clots like warfarin -clarithromycin -digoxin -dolasetron -erythromycin -fluoxetine -grapefruit juice -local or general anesthetics -nefazodone -orlistat -quinupristin; dalfopristin -sirolimus -stomach acid blockers like cimetidine, ranitidine, omeprazole, or pantoprazole -tacrolimus -valproic acid This list may not describe all possible interactions. Give your health care provider a list of all the medicines, herbs, non-prescription drugs, or dietary supplements you use. Also tell them if you smoke, drink alcohol, or use illegal drugs. Some items may interact with  your medicine. What should I watch for while using this medicine? Visit your doctor or health care professional for regular check ups. Check your blood pressure and pulse rate regularly. Ask your doctor or health care professional what your blood pressure and pulse rate should be and when you should contact him or her. You may get drowsy or dizzy. Do not drive, use machinery, or do anything that needs mental alertness until  you know how this medicine affects you. Do not stand or sit up quickly, especially if you are an older patient. This reduces the risk of dizzy or fainting spells. Alcohol may interfere with the effect of this medicine. Avoid alcoholic drinks. If you are taking Procardia XL, you may notice the empty shell of the tablet in your stool. What side effects may I notice from receiving this medicine? Side effects that you should report to your doctor or health care professional as soon as possible: -blood in the urine -difficulty breathing -fast heartbeat, palpitations, irregular heartbeat, chest pain -redness, blistering, peeling or loosening of the skin, including inside the mouth -reduced amount of urine passed -skin rash -swelling of the legs and ankles Side effects that usually do not require medical attention (report to your doctor or health care professional if they continue or are bothersome): -constipation -facial flushing -headache -weakness or tiredness This list may not describe all possible side effects. Call your doctor for medical advice about side effects. You may report side effects to FDA at 1-800-FDA-1088. Where should I keep my medicine? Keep out of the reach of children. Store at room temperature below 30 degrees C (86 degrees F). Protect from moisture and humidity. Keep container tightly closed. Throw away any unused medicine after the expiration date. NOTE: This sheet is a summary. It may not cover all possible information. If you have questions about this medicine, talk to your doctor, pharmacist, or health care provider.  2017 Elsevier/Gold Standard (2014-05-21 10:25:09) Health Maintenance for Postmenopausal Women Introduction Menopause is a normal process in which your reproductive ability comes to an end. This process happens gradually over a span of months to years, usually between the ages of 77 and 30. Menopause is complete when you have missed 12 consecutive menstrual  periods. It is important to talk with your health care provider about some of the most common conditions that affect postmenopausal women, such as heart disease, cancer, and bone loss (osteoporosis). Adopting a healthy lifestyle and getting preventive care can help to promote your health and wellness. Those actions can also lower your chances of developing some of these common conditions. What should I know about menopause? During menopause, you may experience a number of symptoms, such as:  Moderate-to-severe hot flashes.  Night sweats.  Decrease in sex drive.  Mood swings.  Headaches.  Tiredness.  Irritability.  Memory problems.  Insomnia. Choosing to treat or not to treat menopausal changes is an individual decision that you make with your health care provider. What should I know about hormone replacement therapy and supplements? Hormone therapy products are effective for treating symptoms that are associated with menopause, such as hot flashes and night sweats. Hormone replacement carries certain risks, especially as you become older. If you are thinking about using estrogen or estrogen with progestin treatments, discuss the benefits and risks with your health care provider. What should I know about heart disease and stroke? Heart disease, heart attack, and stroke become more likely as you age. This may be due, in part, to the hormonal  changes that your body experiences during menopause. These can affect how your body processes dietary fats, triglycerides, and cholesterol. Heart attack and stroke are both medical emergencies. There are many things that you can do to help prevent heart disease and stroke:  Have your blood pressure checked at least every 1-2 years. High blood pressure causes heart disease and increases the risk of stroke.  If you are 66-18 years old, ask your health care provider if you should take aspirin to prevent a heart attack or a stroke.  Do not use any  tobacco products, including cigarettes, chewing tobacco, or electronic cigarettes. If you need help quitting, ask your health care provider.  It is important to eat a healthy diet and maintain a healthy weight.  Be sure to include plenty of vegetables, fruits, low-fat dairy products, and lean protein.  Avoid eating foods that are high in solid fats, added sugars, or salt (sodium).  Get regular exercise. This is one of the most important things that you can do for your health.  Try to exercise for at least 150 minutes each week. The type of exercise that you do should increase your heart rate and make you sweat. This is known as moderate-intensity exercise.  Try to do strengthening exercises at least twice each week. Do these in addition to the moderate-intensity exercise.  Know your numbers.Ask your health care provider to check your cholesterol and your blood glucose. Continue to have your blood tested as directed by your health care provider. What should I know about cancer screening? There are several types of cancer. Take the following steps to reduce your risk and to catch any cancer development as early as possible. Breast Cancer  Practice breast self-awareness.  This means understanding how your breasts normally appear and feel.  It also means doing regular breast self-exams. Let your health care provider know about any changes, no matter how small.  If you are 40 or older, have a clinician do a breast exam (clinical breast exam or CBE) every year. Depending on your age, family history, and medical history, it may be recommended that you also have a yearly breast X-ray (mammogram).  If you have a family history of breast cancer, talk with your health care provider about genetic screening.  If you are at high risk for breast cancer, talk with your health care provider about having an MRI and a mammogram every year.  Breast cancer (BRCA) gene test is recommended for women who  have family members with BRCA-related cancers. Results of the assessment will determine the need for genetic counseling and BRCA1 and for BRCA2 testing. BRCA-related cancers include these types:  Breast. This occurs in males or females.  Ovarian.  Tubal. This may also be called fallopian tube cancer.  Cancer of the abdominal or pelvic lining (peritoneal cancer).  Prostate.  Pancreatic. Cervical, Uterine, and Ovarian Cancer  Your health care provider may recommend that you be screened regularly for cancer of the pelvic organs. These include your ovaries, uterus, and vagina. This screening involves a pelvic exam, which includes checking for microscopic changes to the surface of your cervix (Pap test).  For women ages 21-65, health care providers may recommend a pelvic exam and a Pap test every three years. For women ages 64-65, they may recommend the Pap test and pelvic exam, combined with testing for human papilloma virus (HPV), every five years. Some types of HPV increase your risk of cervical cancer. Testing for HPV may also be done  on women of any age who have unclear Pap test results.  Other health care providers may not recommend any screening for nonpregnant women who are considered low risk for pelvic cancer and have no symptoms. Ask your health care provider if a screening pelvic exam is right for you.  If you have had past treatment for cervical cancer or a condition that could lead to cancer, you need Pap tests and screening for cancer for at least 20 years after your treatment. If Pap tests have been discontinued for you, your risk factors (such as having a new sexual partner) need to be reassessed to determine if you should start having screenings again. Some women have medical problems that increase the chance of getting cervical cancer. In these cases, your health care provider may recommend that you have screening and Pap tests more often.  If you have a family history of uterine  cancer or ovarian cancer, talk with your health care provider about genetic screening.  If you have vaginal bleeding after reaching menopause, tell your health care provider.  There are currently no reliable tests available to screen for ovarian cancer. Lung Cancer  Lung cancer screening is recommended for adults 34-33 years old who are at high risk for lung cancer because of a history of smoking. A yearly low-dose CT scan of the lungs is recommended if you:  Currently smoke.  Have a history of at least 30 pack-years of smoking and you currently smoke or have quit within the past 15 years. A pack-year is smoking an average of one pack of cigarettes per day for one year. Yearly screening should:  Continue until it has been 15 years since you quit.  Stop if you develop a health problem that would prevent you from having lung cancer treatment. Colorectal Cancer  This type of cancer can be detected and can often be prevented.  Routine colorectal cancer screening usually begins at age 64 and continues through age 28.  If you have risk factors for colon cancer, your health care provider may recommend that you be screened at an earlier age.  If you have a family history of colorectal cancer, talk with your health care provider about genetic screening.  Your health care provider may also recommend using home test kits to check for hidden blood in your stool.  A small camera at the end of a tube can be used to examine your colon directly (sigmoidoscopy or colonoscopy). This is done to check for the earliest forms of colorectal cancer.  Direct examination of the colon should be repeated every 5-10 years until age 61. However, if early forms of precancerous polyps or small growths are found or if you have a family history or genetic risk for colorectal cancer, you may need to be screened more often. Skin Cancer  Check your skin from head to toe regularly.  Monitor any moles. Be sure to tell  your health care provider:  About any new moles or changes in moles, especially if there is a change in a mole's shape or color.  If you have a mole that is larger than the size of a pencil eraser.  If any of your family members has a history of skin cancer, especially at a young age, talk with your health care provider about genetic screening.  Always use sunscreen. Apply sunscreen liberally and repeatedly throughout the day.  Whenever you are outside, protect yourself by wearing long sleeves, pants, a wide-brimmed hat, and sunglasses. What should  I know about osteoporosis? Osteoporosis is a condition in which bone destruction happens more quickly than new bone creation. After menopause, you may be at an increased risk for osteoporosis. To help prevent osteoporosis or the bone fractures that can happen because of osteoporosis, the following is recommended:  If you are 2-64 years old, get at least 1,000 mg of calcium and at least 600 mg of vitamin D per day.  If you are older than age 72 but younger than age 31, get at least 1,200 mg of calcium and at least 600 mg of vitamin D per day.  If you are older than age 11, get at least 1,200 mg of calcium and at least 800 mg of vitamin D per day. Smoking and excessive alcohol intake increase the risk of osteoporosis. Eat foods that are rich in calcium and vitamin D, and do weight-bearing exercises several times each week as directed by your health care provider. What should I know about how menopause affects my mental health? Depression may occur at any age, but it is more common as you become older. Common symptoms of depression include:  Low or sad mood.  Changes in sleep patterns.  Changes in appetite or eating patterns.  Feeling an overall lack of motivation or enjoyment of activities that you previously enjoyed.  Frequent crying spells. Talk with your health care provider if you think that you are experiencing depression. What should  I know about immunizations? It is important that you get and maintain your immunizations. These include:  Tetanus, diphtheria, and pertussis (Tdap) booster vaccine.  Influenza every year before the flu season begins.  Pneumonia vaccine.  Shingles vaccine. Your health care provider may also recommend other immunizations. This information is not intended to replace advice given to you by your health care provider. Make sure you discuss any questions you have with your health care provider. Document Released: 05/08/2005 Document Revised: 10/04/2015 Document Reviewed: 12/18/2014  2017 [employer]vier

## 2016-04-13 NOTE — Progress Notes (Signed)
Patient: Dawn Adkins, Female    DOB: 1955/05/08, 61 y.o.   MRN: [id number] Visit Date: 04/13/2016  Today's Provider: Mar Daring, PA-C   Chief Complaint  Patient presents with  . Annual Exam   Subjective:    Annual physical exam Dawn Adkins is a 61 y.o. female who presents today for health maintenance and complete physical. She feels well. She reports exercising none. She reports she is sleeping fairly well.  04/11/15 CPE 04/11/15 Pap-neg; HPV-neg 04/12/15 Mammogram-BI-RADS 1 04/18/15 Colonoscopy-polyps recheck in 10 yrs -----------------------------------------------------------------   Review of Systems  Constitutional: Positive for fatigue.  HENT: Negative.   Eyes: Positive for itching.  Respiratory: Positive for chest tightness.   Cardiovascular: Positive for chest pain.  Gastrointestinal: Negative.   Endocrine: Negative.   Genitourinary: Negative.   Musculoskeletal: Positive for back pain.  Skin: Positive for color change.  Allergic/Immunologic: Negative.   Neurological: Positive for numbness (fingers) and headaches.  Hematological: Bruises/bleeds easily.  Psychiatric/Behavioral: Negative.     Social History      She  reports that she has never smoked. She has never used smokeless tobacco. She reports that she drinks about 2.4 oz of alcohol per week . She reports that she does not use drugs.       Social History   Social History  . Marital status: Married    Spouse name: Celvin  . Number of children: 3  . Years of education: N/A   Occupational History  . part itme    Social History Main Topics  . Smoking status: Never Smoker  . Smokeless tobacco: Never Used  . Alcohol use 2.4 oz/week    4 Cans of beer per week     Comment: OCCASIONALLY  . Drug use: No  . Sexual activity: Not Asked   Other Topics Concern  . None   Social History Narrative  . None    Past Medical History:  Diagnosis Date  . Anxiety   . Depression    . GERD (gastroesophageal reflux disease)   . Hyperlipidemia   . Motion sickness    back seat - car     Patient Active Problem List   Diagnosis Date Noted  . Special screening for malignant neoplasms, colon   . Benign neoplasm of sigmoid colon   . Anxiety 09/14/2014  . Clinical depression 09/14/2014  . Acid reflux 09/14/2014  . Insomnia 09/14/2014  . Avitaminosis D 09/14/2014  . 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 Hypercholesterolemia without hypertriglyceridemia 08/21/2004    Past Surgical History:  Procedure Laterality Date  . COLONOSCOPY  2011  . COLONOSCOPY WITH PROPOFOL N/A 04/18/2015   Procedure: COLONOSCOPY WITH PROPOFOL;  Surgeon: Lucilla Lame, MD;  Location: [place];  Service: Endoscopy;  Laterality: N/A;  . POLYPECTOMY  04/18/2015   Procedure: POLYPECTOMY;  Surgeon: Lucilla Lame, MD;  Location: [place];  Service: Endoscopy;;  . TUBAL LIGATION  1984    Family History        Family Status  Relation Status  . Mother Deceased  . Son Alive  . Father Deceased  . Brother Alive  . Brother Alive  . Brother Alive  . Son Alive  . Daughter Alive        Her family history includes Cervical cancer in her mother; Diabetes in her son; Healthy in her daughter and son; Heart disease in her mother; Hypertension in her mother; Mental illness in her son; Seizures in her brother;  Seizures (age of onset: 44) in her brother.     No Known Allergies   Current Outpatient Prescriptions:  .  aspirin 81 MG tablet, Take 81 mg by mouth daily., Disp: , Rfl:  .  atorvastatin (LIPITOR) 10 MG tablet, Take 1 tablet (10 mg total) by mouth daily., Disp: 90 tablet, Rfl: 1 .  Ergocalciferol (VITAMIN D2) 2000 UNITS TABS, Take 1 tablet by mouth daily., Disp: , Rfl:  .  escitalopram (LEXAPRO) 10 MG tablet, Take 1 tablet (10 mg total) by mouth daily., Disp: 90 tablet, Rfl: 3 .  pantoprazole (PROTONIX) 40 MG tablet, Take 1 tablet (40 mg total) by mouth daily., Disp: 30  tablet, Rfl: 11 .  traZODone (DESYREL) 50 MG tablet, TAKE 1 TABLET BY MOUTH ONCE DAILY., Disp: 30 tablet, Rfl: 5   Patient Care Team: Mar Daring, PA-C as PCP - General (Physician Assistant)      Objective:   Vitals: BP (!) 160/100 (BP Location: Left Arm, Patient Position: Sitting, Cuff Size: Large)   Pulse (!) 56   Temp 97.6 F (36.4 C) (Oral)   Resp 16   Ht 4\' 11"  (1.499 m)   Wt 134 lb (60.8 kg)   SpO2 99%   BMI 27.06 kg/m    Physical Exam  Constitutional: She is oriented to person, place, and time. She appears well-developed and well-nourished. No distress.  HENT:  Head: Normocephalic and atraumatic.  Right Ear: Hearing, tympanic membrane, external ear and ear canal normal.  Left Ear: Hearing, tympanic membrane, external ear and ear canal normal.  Nose: Nose normal.  Mouth/Throat: Uvula is midline, oropharynx is clear and moist and mucous membranes are normal. No oropharyngeal exudate.  Eyes: Conjunctivae and EOM are normal. Pupils are equal, round, and reactive to light. Right eye exhibits no discharge. Left eye exhibits no discharge. No scleral icterus.  Neck: Normal range of motion. Neck supple. No JVD present. Carotid bruit is not present. No tracheal deviation present. No thyromegaly present.  Cardiovascular: Normal rate, regular rhythm, normal heart sounds and intact distal pulses.  Exam reveals no gallop and no friction rub.   No murmur heard. Pulmonary/Chest: Effort normal and breath sounds normal. No respiratory distress. She has no wheezes. She has no rales. She exhibits no tenderness. Right breast exhibits no inverted nipple, no mass, no nipple discharge, no skin change and no tenderness. Left breast exhibits no inverted nipple, no mass, no nipple discharge, no skin change and no tenderness. Breasts are symmetrical.  Abdominal: Soft. Bowel sounds are normal. She exhibits no distension and no mass. There is no tenderness. There is no rebound and no guarding.    Musculoskeletal: Normal range of motion. She exhibits no edema or tenderness.  Lymphadenopathy:    She has no cervical adenopathy.  Neurological: She is alert and oriented to person, place, and time.  Skin: Skin is warm and dry. No rash noted. She is not diaphoretic.  Psychiatric: She has a normal mood and affect. Her behavior is normal. Judgment and thought content normal.  Vitals reviewed.   Depression Screen PHQ 2/9 Scores 04/13/2016 09/17/2014  PHQ - 2 Score 0 4  PHQ- 9 Score - 8     Assessment & Plan:     Routine Health Maintenance and Physical Exam  Exercise Activities and Dietary recommendations Goals    None      Immunization History  Administered Date(s) Administered  . Influenza,inj,Quad PF,36+ Mos 03/14/2015  . Td 05/29/2004  . Tdap 11/28/2009  Health Maintenance  Topic Date Due  . Hepatitis C Screening  05-Jun-1955  . HIV Screening  02/24/1973  . MAMMOGRAM  04/11/2017  . PAP SMEAR  04/10/2018  . TETANUS/TDAP  11/29/2019  . COLONOSCOPY  04/17/2025  . INFLUENZA VACCINE  Completed     Discussed health benefits of physical activity, and encouraged her to engage in regular exercise appropriate for her age and condition.   1. Annual physical exam Normal physical exam today. Will check labs as below and f/u pending lab results. If labs are stable and WNL she will not need to have these rechecked for one year at her next annual physical exam. She is to call the office in the meantime if she has any acute issue, questions or concerns.  2. Hypercholesterolemia without hypertriglyceridemia Stable. Continue current medical treatment plan with atorvastatin 10mg . Will check labs as below and f/u pending results. - Lipid Panel With LDL/HDL Ratio  3. Gastroesophageal reflux disease, esophagitis presence not specified Stable. Continue protonix 40mg  daily. Will check labs as below and f/u pending results. - Comprehensive metabolic panel - CBC with  Differential/Platelet  4. Anxiety Stable. Diagnosis pulled for medication refill. Continue current medical treatment plan. Will check labs as below and f/u pending results. - TSH - escitalopram (LEXAPRO) 10 MG tablet; Take 1 tablet (10 mg total) by mouth daily.  Dispense: 90 tablet; Refill: 3  5. Raynaud's syndrome without gangrene New onset. Patient advised of lifestyle modifications for Raynaud's. Will start procardia as below. Advised patient to monitor BP at home. She is to call if this dose helps lessen symptoms or if symptoms worsen. - NIFEdipine (PROCARDIA-XL/ADALAT-CC/NIFEDICAL-XL) 30 MG 24 hr tablet; Take 1 tablet (30 mg total) by mouth daily.  Dispense: 30 tablet; Refill: 1  6. Need for hepatitis C screening test - Hepatitis C Antibody  7. Encounter for screening for HIV - HIV antibody (with reflex)  8. Family history of diabetes mellitus Family history of diabetes in her son. Will check labs as below and f/u pending results. - HgB A1c  9. Breast cancer screening Breast exam today was normal. There is no family history of breast cancer. She does not perform regular self breast exams. Mammogram was ordered as below. Information for [facility] was given to patient so she may schedule her mammogram at her convenience. - MM Digital Screening; Future   --------------------------------------------------------------------    Mar Daring, PA-C  [facility]

## 2016-04-14 ENCOUNTER — Telehealth: Payer: Self-pay

## 2016-04-14 LAB — LIPID PANEL WITH LDL/HDL RATIO
Cholesterol, Total: 156 mg/dL (ref 100–199)
HDL: 52 mg/dL (ref >39)
LDL Calculated: 86 mg/dL (ref 0–99)
LDL/HDL RATIO: 1.7 ratio (ref 0.0–3.2)
Triglycerides: 91 mg/dL (ref 0–149)
VLDL CHOLESTEROL CAL: 18 mg/dL (ref 5–40)

## 2016-04-14 LAB — CBC WITH DIFFERENTIAL/PLATELET
BASOS: 0 %
Basophils Absolute: 0 10*3/uL (ref 0.0–0.2)
EOS (ABSOLUTE): 0.1 10*3/uL (ref 0.0–0.4)
EOS: 1 %
HEMATOCRIT: 38.3 % (ref 34.0–46.6)
Hemoglobin: 12.7 g/dL (ref 11.1–15.9)
IMMATURE GRANS (ABS): 0 10*3/uL (ref 0.0–0.1)
Immature Granulocytes: 0 %
Lymphocytes Absolute: 2.3 10*3/uL (ref 0.7–3.1)
Lymphs: 32 %
MCH: 29.1 pg (ref 26.6–33.0)
MCHC: 33.2 g/dL (ref 31.5–35.7)
MCV: 88 fL (ref 79–97)
MONOS ABS: 0.4 10*3/uL (ref 0.1–0.9)
Monocytes: 5 %
Neutrophils Absolute: 4.4 10*3/uL (ref 1.4–7.0)
Neutrophils: 62 %
PLATELETS: 301 10*3/uL (ref 150–379)
RBC: 4.37 x10E6/uL (ref 3.77–5.28)
RDW: 13.7 % (ref 12.3–15.4)
WBC: 7.1 10*3/uL (ref 3.4–10.8)

## 2016-04-14 LAB — COMPREHENSIVE METABOLIC PANEL
A/G RATIO: 2.2 (ref 1.2–2.2)
ALBUMIN: 4.6 g/dL (ref 3.5–5.5)
ALT: 18 IU/L (ref 0–32)
AST: 17 IU/L (ref 0–40)
Alkaline Phosphatase: 69 IU/L (ref 39–117)
BUN / CREAT RATIO: 19 (ref 9–23)
BUN: 13 mg/dL (ref 6–24)
Bilirubin Total: 0.5 mg/dL (ref 0.0–1.2)
CALCIUM: 9.4 mg/dL (ref 8.7–10.2)
CO2: 26 mmol/L (ref 18–29)
CREATININE: 0.7 mg/dL (ref 0.57–1.00)
Chloride: 101 mmol/L (ref 96–106)
GFR, EST AFRICAN AMERICAN: 110 mL/min/{1.73_m2} (ref >59)
GFR, EST NON AFRICAN AMERICAN: 96 mL/min/{1.73_m2} (ref >59)
GLOBULIN, TOTAL: 2.1 g/dL (ref 1.5–4.5)
Glucose: 99 mg/dL (ref 65–99)
POTASSIUM: 4.3 mmol/L (ref 3.5–5.2)
SODIUM: 141 mmol/L (ref 134–144)
TOTAL PROTEIN: 6.7 g/dL (ref 6.0–8.5)

## 2016-04-14 LAB — HEMOGLOBIN A1C
Est. average glucose Bld gHb Est-mCnc: 111 mg/dL
HEMOGLOBIN A1C: 5.5 % (ref 4.8–5.6)

## 2016-04-14 LAB — TSH: TSH: 1.87 u[IU]/mL (ref 0.450–4.500)

## 2016-04-14 LAB — HIV ANTIBODY (ROUTINE TESTING W REFLEX): HIV SCREEN 4TH GENERATION: NONREACTIVE

## 2016-04-14 LAB — HEPATITIS C ANTIBODY

## 2016-04-14 NOTE — Telephone Encounter (Signed)
Patient advised as directed below.  Thanks,  -Joseline 

## 2016-04-14 NOTE — Telephone Encounter (Signed)
-----   Message from Mar Daring, PA-C sent at 04/14/2016  8:27 AM EST ----- All labs are within normal limits and stable.  Thanks! -JB

## 2016-05-07 ENCOUNTER — Ambulatory Visit: Payer: Managed Care, Other (non HMO)

## 2016-05-25 ENCOUNTER — Ambulatory Visit
Admission: RE | Admit: 2016-05-25 | Discharge: 2016-05-25 | Disposition: A | Discharge status: Home / Self Care | Payer: Managed Care, Other (non HMO) | Source: Ambulatory Visit | Attending: Physician Assistant | Admitting: Physician Assistant

## 2016-05-25 DIAGNOSIS — Z1231 Encounter for screening mammogram for malignant neoplasm of breast: Secondary | ICD-10-CM | POA: Insufficient documentation

## 2016-05-25 DIAGNOSIS — Z1239 Encounter for other screening for malignant neoplasm of breast: Secondary | ICD-10-CM

## 2016-05-26 ENCOUNTER — Telehealth: Payer: Self-pay

## 2016-05-26 NOTE — Telephone Encounter (Signed)
Patient advised as directed below.  Thanks,  -Jazmaine Fuelling 

## 2016-05-26 NOTE — Telephone Encounter (Signed)
-----   Message from Mar Daring, [place] sent at 05/26/2016 11:03 AM EST ----- Normal mammogram. Repeat screening in one year.

## 2016-06-13 ENCOUNTER — Other Ambulatory Visit: Payer: Self-pay

## 2016-06-13 DIAGNOSIS — I7300 Raynaud's syndrome without gangrene: Secondary | ICD-10-CM

## 2016-06-13 NOTE — Telephone Encounter (Signed)
Refill request from [facility] for Nifedipine 30 mg, please review. Thank you-aa

## 2016-06-15 MED ORDER — NIFEDIPINE ER OSMOTIC RELEASE 30 MG PO TB24: 30.0000 mg | ORAL_TABLET | Freq: Every day | ORAL | 5 refills | Status: DC

## 2016-09-01 ENCOUNTER — Other Ambulatory Visit: Payer: Self-pay | Admitting: Physician Assistant

## 2016-09-01 DIAGNOSIS — K2100 Gastro-esophageal reflux disease with esophagitis, without bleeding: Secondary | ICD-10-CM

## 2016-09-01 NOTE — Telephone Encounter (Signed)
[facility] faxed a refill request on the following medications:  pantoprazole (PROTONIX) 40 MG tablet.  Take 1 tablet by mouth once daily.  90 day supply.  [facility]

## 2016-09-01 NOTE — Telephone Encounter (Signed)
LOV/CPE 04/13/2016. Renaldo Fiddler, CMA

## 2016-09-02 MED ORDER — PANTOPRAZOLE SODIUM 40 MG PO TBEC: 40.0000 mg | DELAYED_RELEASE_TABLET | Freq: Every day | ORAL | 11 refills | Status: DC

## 2016-10-19 ENCOUNTER — Other Ambulatory Visit: Payer: Self-pay | Admitting: Physician Assistant

## 2016-10-19 MED ORDER — TRAZODONE HCL 50 MG PO TABS: 50.0000 mg | ORAL_TABLET | Freq: Every day | ORAL | 5 refills | Status: DC

## 2016-10-19 NOTE — Telephone Encounter (Signed)
[facility] faxed a request for the following medication. Thanks CC  traZODone (DESYREL) 50 MG tablet  > Take 1 tablet by mouth once daily.

## 2016-11-23 ENCOUNTER — Other Ambulatory Visit: Payer: Self-pay | Admitting: Physician Assistant

## 2016-11-23 DIAGNOSIS — I7300 Raynaud's syndrome without gangrene: Secondary | ICD-10-CM

## 2016-11-23 NOTE — Telephone Encounter (Signed)
[facility] faxed a request on the following medication. Thanks CC  NIFEdipine (PROCARDIA-XL/ADALAT-CC/NIFEDICAL-XL) 30 MG 24 hr tablet  >Take 1 Tablet by mouth once daily.

## 2016-11-24 MED ORDER — NIFEDIPINE ER OSMOTIC RELEASE 30 MG PO TB24: 30.0000 mg | ORAL_TABLET | Freq: Every day | ORAL | 5 refills | Status: DC

## 2017-04-13 IMAGING — MG MM DIGITAL SCREENING BILAT W/ TOMO W/ CAD
8 of 12 series · 8 of 28 positions shown · non-contrast
Comparison: Previous exam(s).

CLINICAL DATA: Screening.

EXAM:
2D DIGITAL SCREENING BILATERAL MAMMOGRAM WITH CAD AND ADJUNCT TOMO

[L MLO synth-2D]
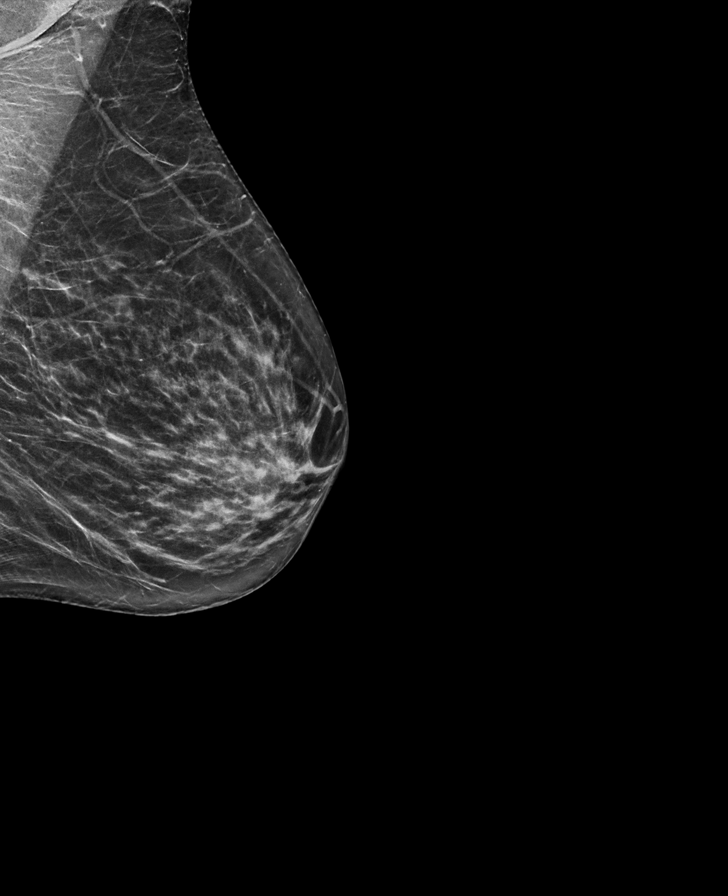

[R MLO synth-2D]
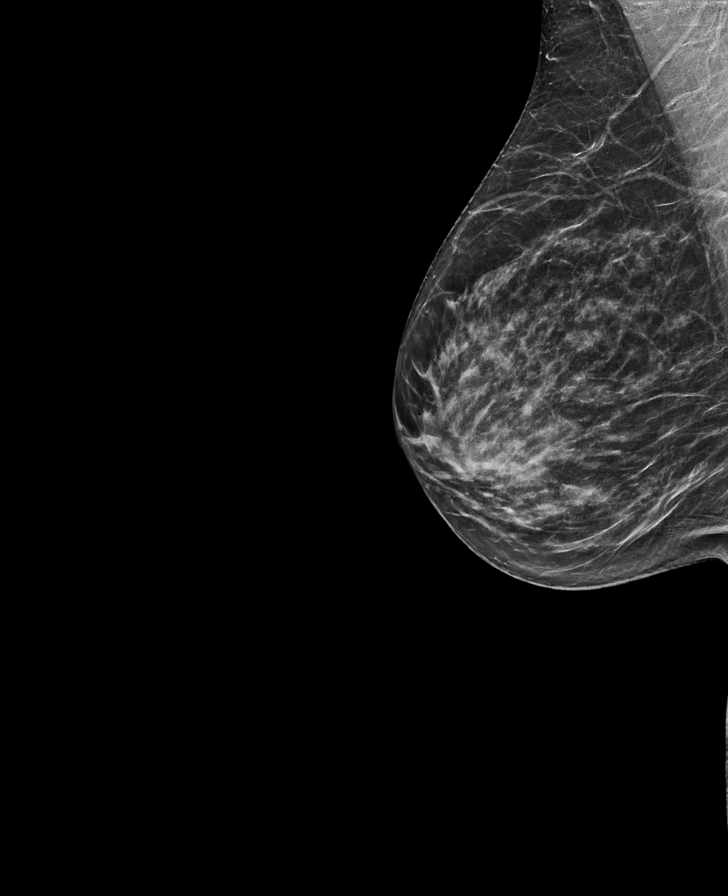

[R CC]
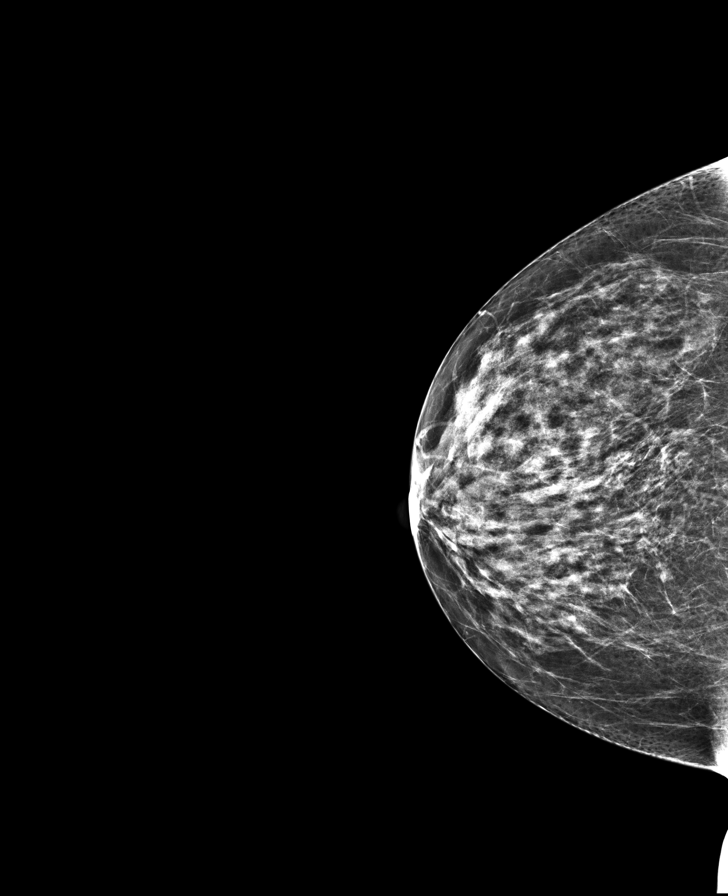

[L CC synth-2D]
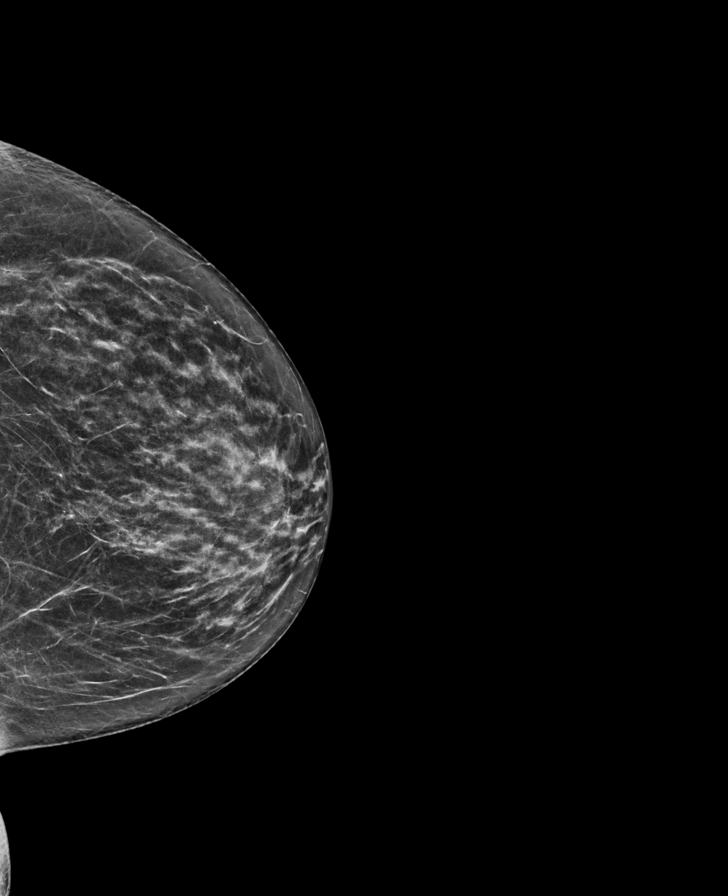

[L MLO]
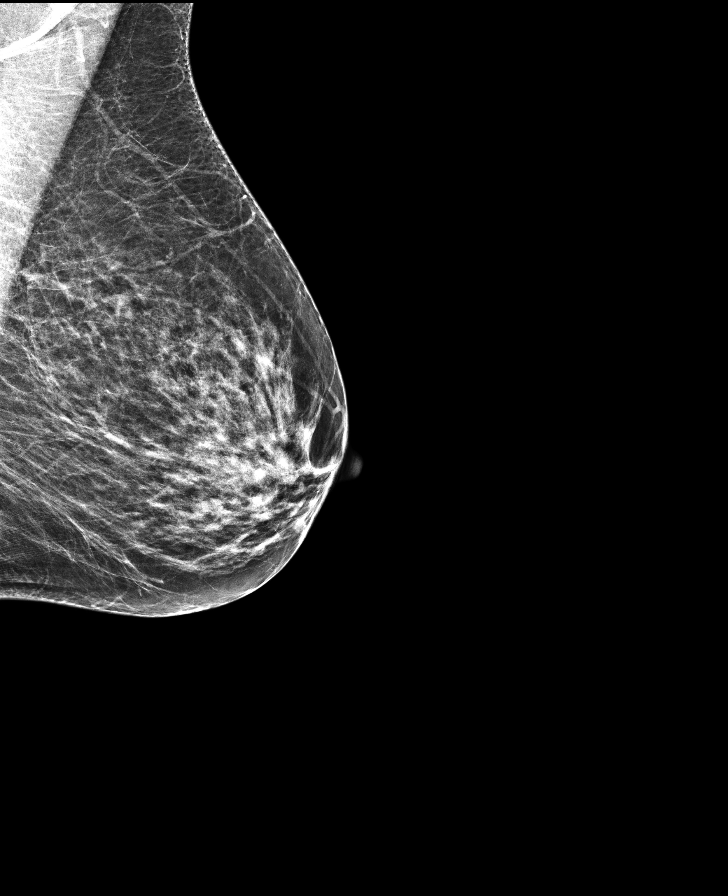

[L CC]
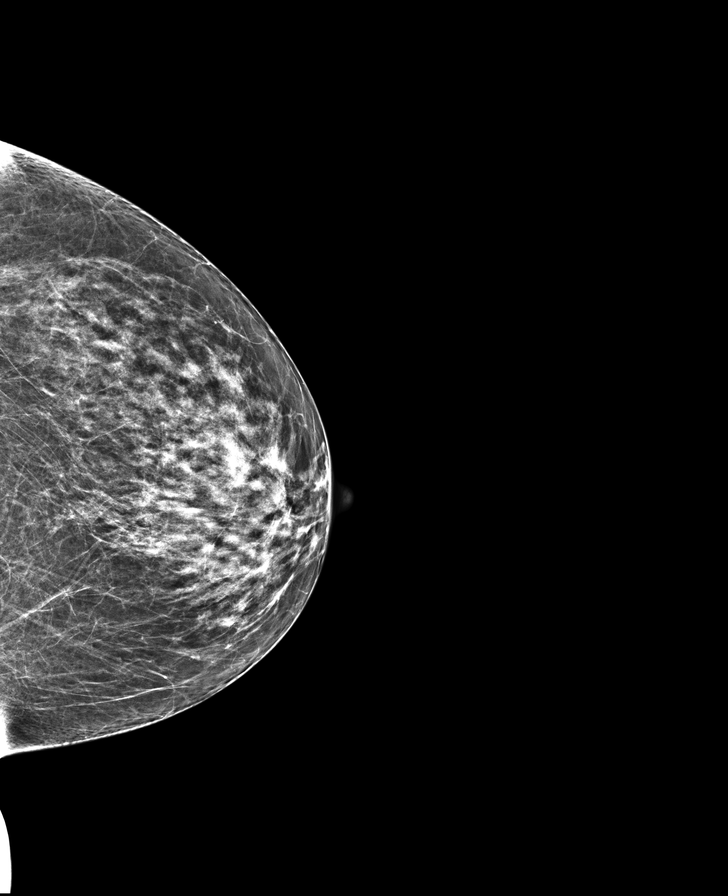

[R MLO]
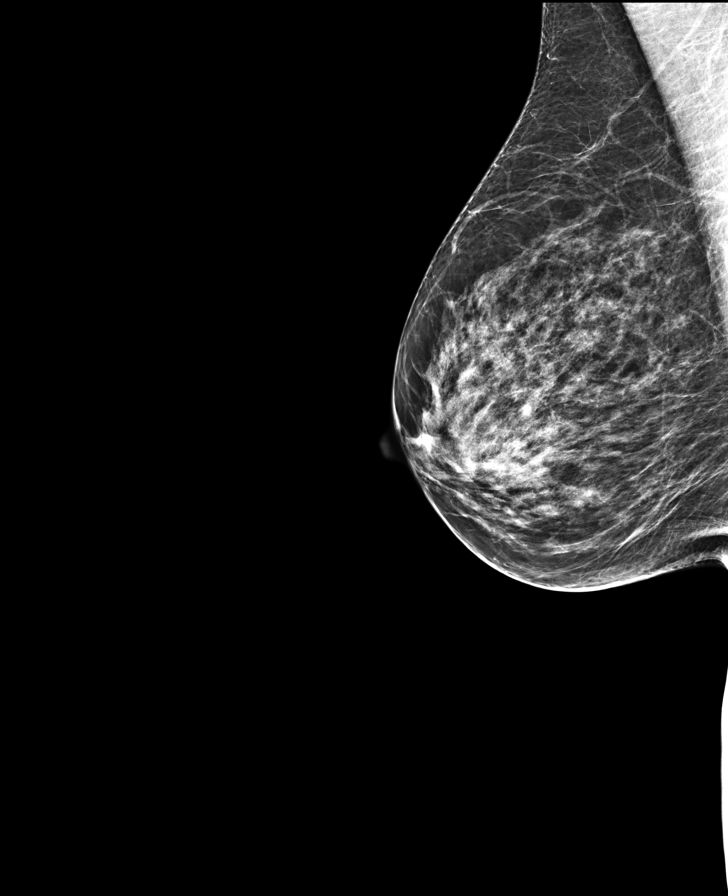

[R CC synth-2D]
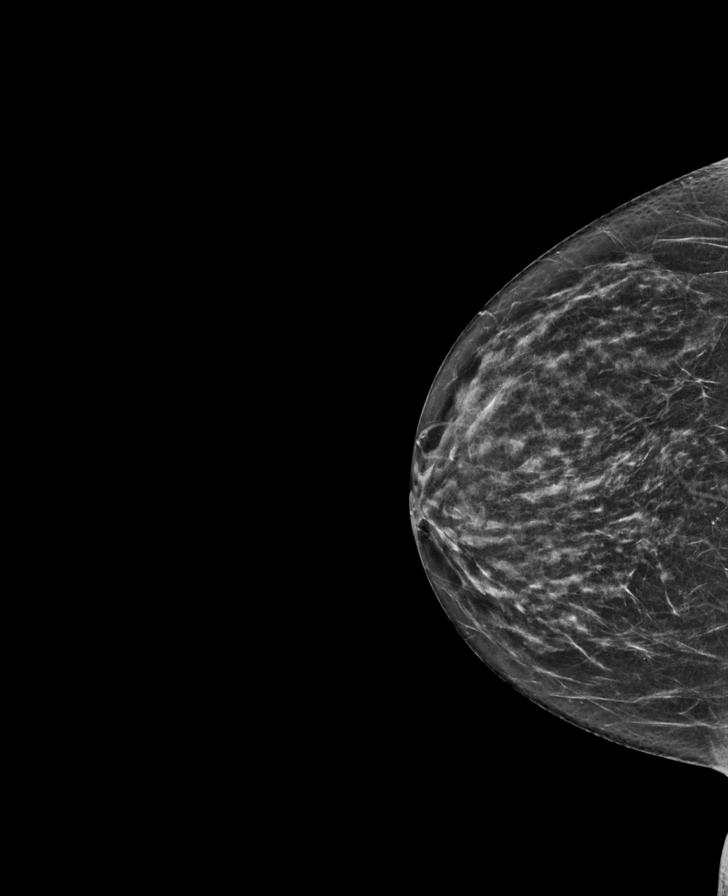

[8 of 28 positions shown; findings below may reference images not displayed]

ACR Breast Density Category c: The breast tissue is heterogeneously
dense, which may obscure small masses.
FINDINGS: There are no findings suspicious for malignancy. Images were
processed with CAD.
IMPRESSION: No mammographic evidence of malignancy. A result letter of this
screening mammogram will be mailed directly to the patient.

RECOMMENDATION:
Screening mammogram in one year. (Code:[id number])

BI-RADS CATEGORY  1: Negative.

## 2017-04-24 ENCOUNTER — Other Ambulatory Visit: Payer: Self-pay | Admitting: Physician Assistant

## 2017-04-24 DIAGNOSIS — F419 Anxiety disorder, unspecified: Secondary | ICD-10-CM

## 2017-05-05 ENCOUNTER — Encounter: Payer: Self-pay | Admitting: Physician Assistant

## 2017-05-05 ENCOUNTER — Ambulatory Visit (INDEPENDENT_AMBULATORY_CARE_PROVIDER_SITE_OTHER): Payer: Managed Care, Other (non HMO) | Admitting: Physician Assistant

## 2017-05-05 VITALS — BP 166/100 | HR 55 | Temp 98.1°F | Resp 16 | Ht 59.0 in | Wt 133.0 lb

## 2017-05-05 DIAGNOSIS — Z0000 Encounter for general adult medical examination without abnormal findings: Secondary | ICD-10-CM

## 2017-05-05 DIAGNOSIS — K219 Gastro-esophageal reflux disease without esophagitis: Secondary | ICD-10-CM

## 2017-05-05 DIAGNOSIS — E559 Vitamin D deficiency, unspecified: Secondary | ICD-10-CM

## 2017-05-05 DIAGNOSIS — F329 Major depressive disorder, single episode, unspecified: Secondary | ICD-10-CM

## 2017-05-05 DIAGNOSIS — R030 Elevated blood-pressure reading, without diagnosis of hypertension: Secondary | ICD-10-CM

## 2017-05-05 DIAGNOSIS — Z0001 Encounter for general adult medical examination with abnormal findings: Secondary | ICD-10-CM

## 2017-05-05 DIAGNOSIS — F5101 Primary insomnia: Secondary | ICD-10-CM

## 2017-05-05 DIAGNOSIS — F32A Depression, unspecified: Secondary | ICD-10-CM

## 2017-05-05 DIAGNOSIS — Z23 Encounter for immunization: Secondary | ICD-10-CM

## 2017-05-05 DIAGNOSIS — F419 Anxiety disorder, unspecified: Secondary | ICD-10-CM

## 2017-05-05 DIAGNOSIS — I7300 Raynaud's syndrome without gangrene: Secondary | ICD-10-CM | POA: Diagnosis not present

## 2017-05-05 DIAGNOSIS — M7062 Trochanteric bursitis, left hip: Secondary | ICD-10-CM | POA: Diagnosis not present

## 2017-05-05 DIAGNOSIS — E7800 Pure hypercholesterolemia, unspecified: Secondary | ICD-10-CM | POA: Diagnosis not present

## 2017-05-05 DIAGNOSIS — Z1239 Encounter for other screening for malignant neoplasm of breast: Secondary | ICD-10-CM

## 2017-05-05 DIAGNOSIS — M7632 Iliotibial band syndrome, left leg: Secondary | ICD-10-CM

## 2017-05-05 MED ORDER — NIFEDIPINE ER OSMOTIC RELEASE 30 MG PO TB24: 30.0000 mg | ORAL_TABLET | Freq: Every day | ORAL | 1 refills | Status: DC

## 2017-05-05 MED ORDER — METHYLPREDNISOLONE 4 MG PO TBPK: ORAL_TABLET | ORAL | 0 refills | Status: DC

## 2017-05-05 MED ORDER — TRAZODONE HCL 50 MG PO TABS: 25.0000 mg | ORAL_TABLET | Freq: Every day | ORAL | 1 refills | Status: DC

## 2017-05-05 MED ORDER — ATORVASTATIN CALCIUM 10 MG PO TABS: 10.0000 mg | ORAL_TABLET | Freq: Every day | ORAL | 1 refills | Status: DC

## 2017-05-05 NOTE — Patient Instructions (Signed)
Iliotibial Band Syndrome Rehab Ask your health care provider which exercises are safe for you. Do exercises exactly as told by your health care provider and adjust them as directed. It is normal to feel mild stretching, pulling, tightness, or discomfort as you do these exercises, but you should stop right away if you feel sudden pain or your pain gets worse.Do not begin these exercises until told by your health care provider. Stretching and range of motion exercises These exercises warm up your muscles and joints and improve the movement and flexibility of your hip and pelvis. Exercise A: Quadriceps, prone  1. Lie on your abdomen on a firm surface, such as a bed or padded floor. 2. Bend your left / right knee and hold your ankle. If you cannot reach your ankle or pant leg, loop a belt around your foot and grab the belt instead. 3. Gently pull your heel toward your buttocks. Your knee should not slide out to the side. You should feel a stretch in the front of your thigh and knee. 4. Hold this position for __________ seconds. Repeat __________ times. Complete this stretch __________ times a day. Exercise B: Iliotibial band  1. Lie on your side with your left / right leg in the top position. 2. Bend both of your knees and grab your left / right ankle. Stretch out your bottom arm to help you balance. 3. Slowly bring your top knee back so your thigh goes behind your trunk. 4. Slowly lower your top leg toward the floor until you feel a gentle stretch on the outside of your left / right hip and thigh. If you do not feel a stretch and your knee will not fall farther, place the heel of your other foot on top of your knee and pull your knee down toward the floor with your foot. 5. Hold this position for __________ seconds. Repeat __________ times. Complete this stretch __________ times a day. Strengthening exercises These exercises build strength and endurance in your hip and pelvis. Endurance is the  ability to use your muscles for a long time, even after they get tired. Exercise C: Straight leg raises ( hip abductors) 1. Lie on your side with your left / right leg in the top position. Lie so your head, shoulder, knee, and hip line up. You may bend your bottom knee to help you balance. 2. Roll your hips slightly forward so your hips are stacked directly over each other and your left / right knee is facing forward. 3. Tense the muscles in your outer thigh and lift your top leg 4-6 inches (10-15 cm). 4. Hold this position for __________ seconds. 5. Slowly return to the starting position. Let your muscles relax completely before doing another repetition. Repeat __________ times. Complete this exercise __________ times a day. Exercise D: Straight leg raises ( hip extensors) 1. Lie on your abdomen on your bed or a firm surface. You can put a pillow under your hips if that is more comfortable. 2. Bend your left / right knee so your foot is straight up in the air. 3. Squeeze your buttock muscles and lift your left / right thigh off the bed. Do not let your back arch. 4. Tense this muscle as hard as you can without increasing any knee pain. 5. Hold this position for __________ seconds. 6. Slowly lower your leg to the starting position and allow it to relax completely. Repeat __________ times. Complete this exercise __________ times a day. Exercise E: Hip  hike 1. Stand sideways on a bottom step. Stand on your left / right leg with your other foot unsupported next to the step. You can hold onto the railing or wall if needed for balance. 2. Keep your knees straight and your torso square. Then, lift your left / right hip up toward the ceiling. 3. Slowly let your left / right hip lower toward the floor, past the starting position. Your foot should get closer to the floor. Do not lean or bend your knees. Repeat __________ times. Complete this exercise __________ times a day. This information is not  intended to replace advice given to you by your health care provider. Make sure you discuss any questions you have with your health care provider. Document Released: 03/16/2005 Document Revised: 11/19/2015 Document Reviewed: 02/15/2015 Elsevier Interactive Patient Education  2018 [place] Bursitis Iliotibial bursitis is inflammation of the bursa on the outside of the knee. A bursa is a fluid-filled sac that is often found near a joint. Bursas act as cushions to help tendons glide smoothly over bony surfaces during joint movement. The iliotibial bursa is located beneath a long tendon (iliotibial band) that connects muscles of the buttock, hip, and upper leg to the outside of the shin bone. This condition is also called iliotibial band friction syndrome. What are the causes? This condition is caused by repeated rubbing of the tendon over the bursa, which occurs with repetitive activity. This friction causes fluid to build up inside the bursa. The bursa swells, and that causes pain in the area where it is located. What increases the risk? The following factors may make you more likely to develop this condition:  Doing athletic activities that involve repetitive squatting, running, cutting, and side-to-side movements.  Overtraining, or starting a new athletic activity without gradually increasing your time and distance.  Participating in certain sports, such as: ? Basketball. ? Cross country running. ? Football. ? Rugby. ? Racquet sports. ? Soccer. ? Volleyball. ? Cycling.  Being 26-45 years old and having knee arthritis.  Being a middle-aged woman who is overweight.  Having flat feet or knee deformities.  Having diabetes.  What are the signs or symptoms? Symptoms of this condition include:  Pain on the outside of your knee. The pain may also be felt on the outside of your leg near the knee.  Pain that gets worse with activity, especially stairs or prolonged walking  or running.  Tenderness when pressing on the side of your knee.  Knee swelling that may or may not include increased warmth or redness.  How is this diagnosed? This condition is usually diagnosed based on your symptoms, your medical history, and a physical exam. During the exam, your health care provider will check your:  Knee motion.  Knee strength.  Amount of pain when the outside of your knee is touched or pressed on.  Ability to do activities such as walking or stairs.  Rarely, other tests may be done to rule out other causes of your symptoms. These tests may include:  MRI.  Ultrasound.  An injection of numbing medicine into the bursa to see if the pain will go away.  How is this treated? Treatment for this condition may include:  Avoiding activities that cause pain and swelling.  Icing your knee.  Wearing an elastic wrap or sleeve to support your knee.  Keeping your knee raised (elevated) when resting.  Taking an NSAID to reduce pain and swelling.  Getting injected with a numbing medicine  and an anti-inflammatory medicine (steroid).  Doing stretching and strengthening exercises.  Treatment usually improves the pain in 6-8 weeks. Surgery is sometimes needed to drain or remove the bursa. Follow these instructions at home: If you have a compression wrap or sleeve:  Wear it as told by your health care provider. Remove it only as told by your health care provider.  Loosen the wrap or sleeve if your foot or toes tingle, become numb, or turn cold and blue.  Do not let the wrap or sleeve get wet.  Keep the wrap or sleeve clean. Managing pain, stiffness, and swelling   If directed, put ice on the knee. ? Put ice in a plastic bag. ? Place a towel between your skin and the bag. ? Leave the ice on for 20 minutes, 2-3 times a day.  Elevate your leg above the level of your heart while you are sitting or lying down. Activity  Return to your normal activities as  told by your health care provider. Ask your health care provider what activities are safe for you.  Do exercises as told by your health care provider. General instructions  Take over-the-counter and prescription medicines only as told by your health care provider.  Keep all follow-up visits as told by your health care provider. This is important. How is this prevented?  Warm up and stretch before being active.  Cool down and stretch after being active.  Give your body time to rest between periods of activity.  Make sure to use equipment that fits you.  Maintain physical fitness, including: ? Strength. ? Flexibility. Contact a health care provider if:  You have pain that is not relieved by rest or treatment.  Your symptoms get worse or do not improve with home care. This information is not intended to replace advice given to you by your health care provider. Make sure you discuss any questions you have with your health care provider. Document Released: 03/16/2005 Document Revised: 11/21/2015 Document Reviewed: 02/26/2015 Elsevier Interactive Patient Education  [employer].

## 2017-05-05 NOTE — Progress Notes (Signed)
Patient: Dawn Adkins, Female    DOB: [birth year], 62 y.o.   MRN: [id number] Visit Date: 05/05/2017  Today's Provider: Mar Daring, PA-C   Chief Complaint  Patient presents with  . Annual Exam   Subjective:    Annual physical exam Dawn Adkins is a 62 y.o. female who presents today for health maintenance and complete physical. She feels well. She reports exercising none. She reports she is sleeping fairly well. 04/13/16 CPE 04/11/15 Pap-neg; HPV-neg 05/25/16 Mammogram-BI-RADS 1 04/18/15 Colonoscopy-polys, recheck 10 years -----------------------------------------------------------------  Patient C/O left leg/thigh pain since last summer daily. Patient reports she is unable to lay on her left side. Patient reports that it is painful to stand after sitting for a while. Patient denies any injuries. Patient reports she did try taking aspirin for pain reports no symptom control.   Follow up for hypercholesterolemia   The patient was last seen for this 1 years ago. Changes made at last visit include check labs.  She reports poor compliance with treatment. Patient reports she has been off Atorvastatin since last summer. Patient reports she just forgot to continue taking medication. She feels that condition is Unchanged. She is not having side effects.  ------------------------------------------------------------------------------------  Review of Systems  Constitutional: Positive for activity change.  HENT: Positive for rhinorrhea and sneezing.   Eyes: Negative.   Respiratory: Negative.   Cardiovascular: Negative.   Gastrointestinal: Positive for abdominal distention.  Endocrine: Negative.   Genitourinary: Negative.   Musculoskeletal: Positive for arthralgias and back pain.  Skin: Negative.   Allergic/Immunologic: Negative.   Neurological: Positive for headaches.  Hematological: Bruises/bleeds easily.  Psychiatric/Behavioral: Negative.     Social  History      She  reports that  has never smoked. she has never used smokeless tobacco. She reports that she drinks about 2.4 oz of alcohol per week. She reports that she does not use drugs.       Social History   Socioeconomic History  . Marital status: Married    Spouse name: Celvin  . Number of children: 3  . Years of education: None  . Highest education level: None  Social Needs  . Financial resource strain: None  . Food insecurity - worry: None  . Food insecurity - inability: None  . Transportation needs - medical: None  . Transportation needs - non-medical: None  Occupational History  . Occupation: part itme  Tobacco Use  . Smoking status: Never Smoker  . Smokeless tobacco: Never Used  Substance and Sexual Activity  . Alcohol use: Yes    Alcohol/week: 2.4 oz    Types: 4 Cans of beer per week    Comment: OCCASIONALLY  . Drug use: No  . Sexual activity: None  Other Topics Concern  . None  Social History Narrative  . None    Past Medical History:  Diagnosis Date  . Anxiety   . Depression   . GERD (gastroesophageal reflux disease)   . Hyperlipidemia   . Motion sickness    back seat - car     Patient Active Problem List   Diagnosis Date Noted  . Raynaud's syndrome without gangrene 04/13/2016  . Special screening for malignant neoplasms, colon   . Benign neoplasm of sigmoid colon   . Anxiety 09/14/2014  . Clinical depression 09/14/2014  . Acid reflux 09/14/2014  . Insomnia 09/14/2014  . Avitaminosis D 09/14/2014  . 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 Hypercholesterolemia without hypertriglyceridemia 08/21/2004  Past Surgical History:  Procedure Laterality Date  . COLONOSCOPY  2011  . COLONOSCOPY WITH PROPOFOL N/A 04/18/2015   Procedure: COLONOSCOPY WITH PROPOFOL;  Surgeon: Lucilla Lame, MD;  Location: [place];  Service: Endoscopy;  Laterality: N/A;  . POLYPECTOMY  04/18/2015   Procedure: POLYPECTOMY;  Surgeon: Lucilla Lame, MD;   Location: [place];  Service: Endoscopy;;  . [place]    Family History        Family Status  Relation Name Status  . Mother  Deceased  . Son 1 Alive  . Father  Deceased  . Brother 1 Alive  . Brother 2 Alive  . Brother 3 Alive  . Son 2 Alive  . Daughter  Alive        Her family history includes Cervical cancer in her mother; Diabetes in her son; Healthy in her daughter and son; Heart disease in her mother; Hypertension in her mother; Mental illness in her son; Seizures in her brother; Seizures (age of onset: 67) in her brother.     No Known Allergies   Current Outpatient Medications:  .  aspirin 81 MG tablet, Take 81 mg by mouth daily., Disp: , Rfl:  .  Ergocalciferol (VITAMIN D2) 2000 UNITS TABS, Take 1 tablet by mouth daily., Disp: , Rfl:  .  escitalopram (LEXAPRO) 10 MG tablet, TAKE 1 TABLET BY MOUTH ONCE DAILY, Disp: 90 tablet, Rfl: 1 .  NIFEdipine (PROCARDIA-XL/ADALAT-CC/NIFEDICAL-XL) 30 MG 24 hr tablet, Take 1 tablet (30 mg total) by mouth daily., Disp: 30 tablet, Rfl: 5 .  pantoprazole (PROTONIX) 40 MG tablet, Take 1 tablet (40 mg total) by mouth daily., Disp: 30 tablet, Rfl: 11 .  traZODone (DESYREL) 50 MG tablet, Take 1 tablet (50 mg total) by mouth daily., Disp: 30 tablet, Rfl: 5 .  atorvastatin (LIPITOR) 10 MG tablet, Take 1 tablet (10 mg total) by mouth daily. (Patient not taking: Reported on 05/05/2017), Disp: 90 tablet, Rfl: 1   Patient Care Team: Mar Daring, PA-C as PCP - General (Physician Assistant)      Objective:   Vitals: BP (!) 166/100 (BP Location: Left Arm, Patient Position: Sitting, Cuff Size: Normal)   Pulse (!) 55   Temp 98.1 F (36.7 C)   Resp 16   Ht 4\' 11"  (1.499 m)   Wt 133 lb (60.3 kg)   SpO2 99%   BMI 26.86 kg/m    Vitals:   05/05/17 0914  BP: (!) 166/100  Pulse: (!) 55  Resp: 16  Temp: 98.1 F (36.7 C)  SpO2: 99%  Weight: 133 lb (60.3 kg)  Height: 4\' 11"  (1.499 m)     Physical Exam    Constitutional: She is oriented to person, place, and time. She appears well-developed and well-nourished. No distress.  HENT:  Head: Normocephalic and atraumatic.  Right Ear: Hearing, tympanic membrane, external ear and ear canal normal.  Left Ear: Hearing, tympanic membrane, external ear and ear canal normal.  Nose: Nose normal.  Mouth/Throat: Uvula is midline, oropharynx is clear and moist and mucous membranes are normal. No oropharyngeal exudate.  Eyes: Conjunctivae and EOM are normal. Pupils are equal, round, and reactive to light. Right eye exhibits no discharge. Left eye exhibits no discharge. No scleral icterus.  Neck: Normal range of motion. Neck supple. No JVD present. No tracheal deviation present. No thyromegaly present.  Cardiovascular: Normal rate, regular rhythm, normal heart sounds and intact distal pulses. Exam reveals no gallop and no friction rub.  No murmur heard. Pulmonary/Chest: Effort normal and breath sounds normal. No respiratory distress. She has no wheezes. She has no rales. She exhibits no tenderness. Right breast exhibits no inverted nipple, no mass, no nipple discharge, no skin change and no tenderness. Left breast exhibits no inverted nipple, no mass, no nipple discharge, no skin change and no tenderness. Breasts are symmetrical.  Abdominal: Soft. Bowel sounds are normal. She exhibits no distension and no mass. There is no tenderness. There is no rebound and no guarding.  Musculoskeletal: Normal range of motion. She exhibits no edema or tenderness.  Lymphadenopathy:    She has no cervical adenopathy.  Neurological: She is alert and oriented to person, place, and time.  Skin: Skin is warm and dry. No rash noted. She is not diaphoretic.  Psychiatric: She has a normal mood and affect. Her behavior is normal. Judgment and thought content normal.  Vitals reviewed.    Depression Screen PHQ 2/9 Scores 05/05/2017 04/13/2016 09/17/2014  PHQ - 2 Score 1 0 4  PHQ- 9 Score  4 - 8      Assessment & Plan:     Routine Health Maintenance and Physical Exam  Exercise Activities and Dietary recommendations Goals    None      Immunization History  Administered Date(s) Administered  . Influenza Split 12/19/2010, 12/22/2011  . Influenza,inj,Quad PF,6+ Mos 01/24/2013, 01/26/2014, 03/14/2015  . Td 05/29/2004  . Tdap 11/28/2009    Health Maintenance  Topic Date Due  . INFLUENZA VACCINE  10/28/2016  . PAP SMEAR  04/10/2018  . MAMMOGRAM  05/25/2018  . TETANUS/TDAP  11/29/2019  . COLONOSCOPY  04/17/2025  . Hepatitis C Screening  Completed  . HIV Screening  Completed     Discussed health benefits of physical activity, and encouraged her to engage in regular exercise appropriate for her age and condition.    1. Annual physical exam Normal physical exam today. Will check labs as below and f/u pending lab results. If labs are stable and WNL she will not need to have these rechecked for one year at her next annual physical exam. She is to call the office in the meantime if she has any acute issue, questions or concerns. - CBC w/Diff/Platelet - Comprehensive Metabolic Panel (CMET) - TSH - Lipid Profile - HgB A1c  2. Breast cancer screening Breast exam today was normal. There is no family history of breast cancer. She does perform regular self breast exams. Mammogram was ordered as below. Information for [facility] was given to patient so she may schedule her mammogram at her convenience. - MM Digital Screening; Future  3. Hypercholesterolemia without hypertriglyceridemia H/O elevation but has not been taking atorvastatin. Will check labs as below and f/u pending results. Will restart atorvastatin.  - CBC w/Diff/Platelet - Comprehensive Metabolic Panel (CMET) - Lipid Profile - HgB A1c - atorvastatin (LIPITOR) 10 MG tablet; Take 1 tablet (10 mg total) by mouth daily.  Dispense: 90 tablet; Refill: 1  4. Avitaminosis D H/O this and  postmenopausal on OTC supplementation. Will check labs as below and f/u pending results. - CBC w/Diff/Platelet - Vitamin D (25 hydroxy)  5. Anxiety Will check labs as below and f/u pending results. - TSH  6. Depression, unspecified depression type Fairly stable despite having increased stressors at home. Continue lexapro 10mg .   7. Gastroesophageal reflux disease, esophagitis presence not specified Stable on Pantoprazole 40mg .  8. Trochanteric bursitis of left hip Worsening over last month. Suspect IT band syndrome with  subsequent bursitis. Patient does have to climb a ladder at work daily and this was probably inciting event. Will give medrol dose pak as below then transition to naproxen x 2 weeks after completing medrol dose pak. Exercises and stretches printed for patient. Moist heat to the area, epsom salt soaks. She is to call if no improvement. May require PT. - methylPREDNISolone (MEDROL) 4 MG TBPK tablet; 6 day taper; take as directed on package instructions  Dispense: 21 tablet; Refill: 0  9. It band syndrome, left See above medical treatment plan. - methylPREDNISolone (MEDROL) 4 MG TBPK tablet; 6 day taper; take as directed on package instructions  Dispense: 21 tablet; Refill: 0  10. Raynaud's syndrome without gangrene Stable. Diagnosis pulled for medication refill. Continue current medical treatment plan. - NIFEdipine (PROCARDIA-XL/ADALAT-CC/NIFEDICAL-XL) 30 MG 24 hr tablet; Take 1 tablet (30 mg total) by mouth daily.  Dispense: 90 tablet; Refill: 1  11. Primary insomnia Stable. Diagnosis pulled for medication refill. Continue current medical treatment plan. - traZODone (DESYREL) 50 MG tablet; Take 0.5-1 tablets (25-50 mg total) by mouth daily.  Dispense: 90 tablet; Refill: 1  12. Elevated blood pressure reading Elevated today. Review of past charts show elevation on CPE days but normal on other visits. Patient is going to check her BP in 1-2 weeks and call if still remains  over 140/90. If so will start bp medication. - CBC w/Diff/Platelet - Comprehensive Metabolic Panel (CMET) - Lipid Profile - HgB A1c  13. Need for shingles vaccine Shingrix #1 Vaccine given to patient without complications. Patient sat for 15 minutes after administration and was tolerated well without adverse effects. She will need to return in 2 months for Shingrix #2.  - Varicella-zoster vaccine IM  --------------------------------------------------------------------    Mar Daring, PA-C  [place]

## 2017-05-06 ENCOUNTER — Telehealth: Payer: Self-pay

## 2017-05-06 LAB — COMPREHENSIVE METABOLIC PANEL
A/G RATIO: 2.1 (ref 1.2–2.2)
ALBUMIN: 4.8 g/dL (ref 3.5–5.5)
ALK PHOS: 72 IU/L (ref 39–117)
ALT: 17 IU/L (ref 0–32)
AST: 19 IU/L (ref 0–40)
BILIRUBIN TOTAL: 0.6 mg/dL (ref 0.0–1.2)
BUN / CREAT RATIO: 19 (ref 9–23)
BUN: 13 mg/dL (ref 6–24)
CHLORIDE: 100 mmol/L (ref 96–106)
CO2: 26 mmol/L (ref 20–29)
Calcium: 9.7 mg/dL (ref 8.7–10.2)
Creatinine, Ser: 0.7 mg/dL (ref 0.57–1.00)
GFR calc non Af Amer: 95 mL/min/{1.73_m2} (ref >59)
GFR, EST AFRICAN AMERICAN: 110 mL/min/{1.73_m2} (ref >59)
GLOBULIN, TOTAL: 2.3 g/dL (ref 1.5–4.5)
GLUCOSE: 90 mg/dL (ref 65–99)
POTASSIUM: 5 mmol/L (ref 3.5–5.2)
SODIUM: 141 mmol/L (ref 134–144)
TOTAL PROTEIN: 7.1 g/dL (ref 6.0–8.5)

## 2017-05-06 LAB — CBC WITH DIFFERENTIAL/PLATELET
BASOS ABS: 0.1 10*3/uL (ref 0.0–0.2)
Basos: 1 %
EOS (ABSOLUTE): 0.1 10*3/uL (ref 0.0–0.4)
Eos: 2 %
HEMOGLOBIN: 13.8 g/dL (ref 11.1–15.9)
Hematocrit: 41.6 % (ref 34.0–46.6)
IMMATURE GRANS (ABS): 0 10*3/uL (ref 0.0–0.1)
Immature Granulocytes: 0 %
LYMPHS ABS: 2.8 10*3/uL (ref 0.7–3.1)
LYMPHS: 32 %
MCH: 29.3 pg (ref 26.6–33.0)
MCHC: 33.2 g/dL (ref 31.5–35.7)
MCV: 88 fL (ref 79–97)
MONOCYTES: 5 %
Monocytes Absolute: 0.4 10*3/uL (ref 0.1–0.9)
NEUTROS ABS: 5.2 10*3/uL (ref 1.4–7.0)
Neutrophils: 60 %
Platelets: 339 10*3/uL (ref 150–379)
RBC: 4.71 x10E6/uL (ref 3.77–5.28)
RDW: 14 % (ref 12.3–15.4)
WBC: 8.5 10*3/uL (ref 3.4–10.8)

## 2017-05-06 LAB — LIPID PANEL
Chol/HDL Ratio: 4.9 ratio — ABNORMAL HIGH (ref 0.0–4.4)
Cholesterol, Total: 265 mg/dL — ABNORMAL HIGH (ref 100–199)
HDL: 54 mg/dL (ref >39)
LDL Calculated: 176 mg/dL — ABNORMAL HIGH (ref 0–99)
Triglycerides: 174 mg/dL — ABNORMAL HIGH (ref 0–149)
VLDL Cholesterol Cal: 35 mg/dL (ref 5–40)

## 2017-05-06 LAB — HEMOGLOBIN A1C
ESTIMATED AVERAGE GLUCOSE: 108 mg/dL
HEMOGLOBIN A1C: 5.4 % (ref 4.8–5.6)

## 2017-05-06 LAB — VITAMIN D 25 HYDROXY (VIT D DEFICIENCY, FRACTURES): Vit D, 25-Hydroxy: 36.2 ng/mL (ref 30.0–100.0)

## 2017-05-06 LAB — TSH: TSH: 2 u[IU]/mL (ref 0.450–4.500)

## 2017-05-06 NOTE — Telephone Encounter (Signed)
Patient reports that she has not been taking the Atorvastatin. Patient was advised that medication was sent yesterday for her with three other.  Thanks,  -Joseline

## 2017-05-06 NOTE — Telephone Encounter (Signed)
-----   Message from Mar Daring, [place] sent at 05/06/2017  1:27 PM EST ----- Cholesterol is drastically increased from last year when it had been normal. Make sure to be taking atorvastatin 10mg  daily as it is needed. Cholesterol was normal with it. Also be mindful of healthy lifestyle modifications as well. Blood count normal. Kidney and liver function normal. Thyroid normal. Sugar normal. Vit D normal.

## 2017-05-26 ENCOUNTER — Ambulatory Visit
Admission: RE | Admit: 2017-05-26 | Discharge: 2017-05-26 | Disposition: A | Discharge status: Home / Self Care | Payer: Managed Care, Other (non HMO) | Source: Ambulatory Visit | Attending: Physician Assistant | Admitting: Physician Assistant

## 2017-05-26 DIAGNOSIS — Z1231 Encounter for screening mammogram for malignant neoplasm of breast: Secondary | ICD-10-CM | POA: Insufficient documentation

## 2017-05-26 DIAGNOSIS — Z1239 Encounter for other screening for malignant neoplasm of breast: Secondary | ICD-10-CM

## 2017-05-27 ENCOUNTER — Telehealth: Payer: Self-pay

## 2017-05-27 NOTE — Telephone Encounter (Signed)
No answer

## 2017-05-27 NOTE — Telephone Encounter (Signed)
-----   Message from Mar Daring, [place] sent at 05/27/2017 11:03 AM EST ----- Normal mammogram. Repeat screening in one year.

## 2017-05-28 NOTE — Telephone Encounter (Signed)
Patient's husband advised as directed below. He is on her DPR.  Thanks,  -Ramon Zanders

## 2017-07-05 ENCOUNTER — Encounter: Payer: Self-pay | Admitting: Physician Assistant

## 2017-07-05 ENCOUNTER — Ambulatory Visit (INDEPENDENT_AMBULATORY_CARE_PROVIDER_SITE_OTHER): Payer: Managed Care, Other (non HMO) | Admitting: Physician Assistant

## 2017-07-05 DIAGNOSIS — Z23 Encounter for immunization: Secondary | ICD-10-CM

## 2017-07-05 NOTE — Progress Notes (Signed)
Nurse Visit: Patient here today for second dose of Shingles vaccine. Patient reports good tolerance and no adverse side effects from first Shingles vaccine.

## 2017-08-26 ENCOUNTER — Other Ambulatory Visit: Payer: Self-pay | Admitting: Physician Assistant

## 2017-08-26 DIAGNOSIS — K2100 Gastro-esophageal reflux disease with esophagitis, without bleeding: Secondary | ICD-10-CM

## 2017-11-13 ENCOUNTER — Other Ambulatory Visit: Payer: Self-pay | Admitting: Physician Assistant

## 2017-11-13 DIAGNOSIS — F5101 Primary insomnia: Secondary | ICD-10-CM

## 2017-11-13 DIAGNOSIS — E7800 Pure hypercholesterolemia, unspecified: Secondary | ICD-10-CM

## 2017-11-25 ENCOUNTER — Other Ambulatory Visit: Payer: Self-pay | Admitting: Physician Assistant

## 2017-11-25 DIAGNOSIS — F419 Anxiety disorder, unspecified: Secondary | ICD-10-CM

## 2018-01-14 ENCOUNTER — Other Ambulatory Visit: Payer: Self-pay | Admitting: Physician Assistant

## 2018-01-14 DIAGNOSIS — I7300 Raynaud's syndrome without gangrene: Secondary | ICD-10-CM

## 2018-04-18 ENCOUNTER — Other Ambulatory Visit: Payer: Self-pay | Admitting: Physician Assistant

## 2018-04-18 DIAGNOSIS — Z1231 Encounter for screening mammogram for malignant neoplasm of breast: Secondary | ICD-10-CM

## 2018-05-08 ENCOUNTER — Other Ambulatory Visit: Payer: Self-pay | Admitting: Physician Assistant

## 2018-05-08 DIAGNOSIS — E7800 Pure hypercholesterolemia, unspecified: Secondary | ICD-10-CM

## 2018-05-09 ENCOUNTER — Encounter: Payer: 59 | Admitting: Physician Assistant

## 2018-05-09 NOTE — Progress Notes (Deleted)
Patient: Dawn Adkins, Female    DOB: 1955/08/10, 63 y.o.   MRN: [id number] Visit Date: 05/09/2018  Today's Provider: Mar Daring, PA-C   No chief complaint on file.  Subjective:     Annual physical exam Dawn Adkins is a 63 y.o. female who presents today for health maintenance and complete physical. She feels {DESC; WELL/FAIRLY WELL/POORLY:18703}. She reports exercising ***. She reports she is sleeping {DESC; WELL/FAIRLY WELL/POORLY:18703}.  Mammogram: Scheduled 05/27/2018 Colonoscopy:04/18/15 Pap:04/11/15-Normal -----------------------------------------------------------------   Review of Systems  Constitutional: Negative.   HENT: Negative.   Eyes: Negative.   Respiratory: Negative.   Cardiovascular: Negative.   Gastrointestinal: Negative.   Endocrine: Negative.   Genitourinary: Negative.   Musculoskeletal: Negative.   Skin: Negative.   Allergic/Immunologic: Negative.   Neurological: Negative.   Hematological: Negative.   Psychiatric/Behavioral: Negative.     Social History      She  reports that she has never smoked. She has never used smokeless tobacco. She reports current alcohol use of about 4.0 standard drinks of alcohol per week. She reports that she does not use drugs.       Social History   Socioeconomic History  . Marital status: Married    Spouse name: Celvin  . Number of children: 3  . Years of education: Not on file  . Highest education level: Not on file  Occupational History  . Occupation: part itme  Social Needs  . Financial resource strain: Not on file  . Food insecurity:    Worry: Not on file    Inability: Not on file  . Transportation needs:    Medical: Not on file    Non-medical: Not on file  Tobacco Use  . Smoking status: Never Smoker  . Smokeless tobacco: Never Used  Substance and Sexual Activity  . Alcohol use: Yes    Alcohol/week: 4.0 standard drinks    Types: 4 Cans of beer per week    Comment:  OCCASIONALLY  . Drug use: No  . Sexual activity: Not on file  Lifestyle  . Physical activity:    Days per week: Not on file    Minutes per session: Not on file  . Stress: Not on file  Relationships  . Social connections:    Talks on phone: Not on file    Gets together: Not on file    Attends religious service: Not on file    Active member of club or organization: Not on file    Attends meetings of clubs or organizations: Not on file    Relationship status: Not on file  Other Topics Concern  . Not on file  Social History Narrative  . Not on file    Past Medical History:  Diagnosis Date  . Anxiety   . Depression   . GERD (gastroesophageal reflux disease)   . Hyperlipidemia   . Motion sickness    back seat - car     Patient Active Problem List   Diagnosis Date Noted  . Raynaud's syndrome without gangrene 04/13/2016  . Special screening for malignant neoplasms, colon   . Benign neoplasm of sigmoid colon   . Anxiety 09/14/2014  . Clinical depression 09/14/2014  . Acid reflux 09/14/2014  . Insomnia 09/14/2014  . Avitaminosis D 09/14/2014  . 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 Hypercholesterolemia without hypertriglyceridemia 08/21/2004    Past Surgical History:  Procedure Laterality Date  . COLONOSCOPY  2011  . COLONOSCOPY WITH PROPOFOL N/A 04/18/2015  Procedure: COLONOSCOPY WITH PROPOFOL;  Surgeon: Lucilla Lame, MD;  Location: [place];  Service: Endoscopy;  Laterality: N/A;  . POLYPECTOMY  04/18/2015   Procedure: POLYPECTOMY;  Surgeon: Lucilla Lame, MD;  Location: [place];  Service: Endoscopy;;  . [place]    Family History        Family Status  Relation Name Status  . Mother  Deceased  . Son 1 Alive  . Father  Deceased  . Brother 1 Alive  . Brother 2 Alive  . Brother 3 Alive  . Son 2 Alive  . Daughter  Alive  . Neg Hx  (Not Specified)        Her family history includes Cervical cancer in her mother; Diabetes in  her son; Healthy in her daughter and son; Heart disease in her mother; Hypertension in her mother; Mental illness in her son; Seizures in her brother; Seizures (age of onset: 39) in her brother. There is no history of Breast cancer.      No Known Allergies   Current Outpatient Medications:  .  aspirin 81 MG tablet, Take 81 mg by mouth daily., Disp: , Rfl:  .  atorvastatin (LIPITOR) 10 MG tablet, TAKE 1 TABLET BY MOUTH ONCE DAILY, Disp: 90 tablet, Rfl: 1 .  Ergocalciferol (VITAMIN D2) 2000 UNITS TABS, Take 1 tablet by mouth daily., Disp: , Rfl:  .  escitalopram (LEXAPRO) 10 MG tablet, TAKE 1 TABLET BY MOUTH ONCE DAILY, Disp: 90 tablet, Rfl: 1 .  methylPREDNISolone (MEDROL) 4 MG TBPK tablet, 6 day taper; take as directed on package instructions, Disp: 21 tablet, Rfl: 0 .  NIFEdipine (PROCARDIA-XL/NIFEDICAL-XL) 30 MG 24 hr tablet, TAKE 1 TABLET BY MOUTH ONCE DAILY., Disp: 90 tablet, Rfl: 1 .  pantoprazole (PROTONIX) 40 MG tablet, TAKE 1 TABLET BY MOUTH ONCE DAILY, Disp: 30 tablet, Rfl: 11 .  traZODone (DESYREL) 50 MG tablet, TAKE 1/2 TO 1 TABLET BY MOUTH DAILY., Disp: 90 tablet, Rfl: 1   Patient Care Team: Rubye Beach as PCP - General (Physician Assistant)    Objective:    Vitals: There were no vitals taken for this visit.  There were no vitals filed for this visit.   Physical Exam   Depression Screen PHQ 2/9 Scores 05/05/2017 04/13/2016 09/17/2014  PHQ - 2 Score 1 0 4  PHQ- 9 Score 4 - 8       Assessment & Plan:     Routine Health Maintenance and Physical Exam  Exercise Activities and Dietary recommendations Goals   None     Immunization History  Administered Date(s) Administered  . Influenza Inj Mdck Quad With Preservative 03/15/2017  . Influenza Split 12/19/2010, 12/22/2011  . Influenza,inj,Quad PF,6+ Mos 01/24/2013, 01/26/2014, 03/14/2015  . Td 05/29/2004  . Tdap 11/28/2009  . Zoster Recombinat (Shingrix) 05/05/2017, 07/05/2017    Health  Maintenance  Topic Date Due  . INFLUENZA VACCINE  10/28/2017  . PAP SMEAR-Modifier  04/10/2018  . MAMMOGRAM  05/27/2019  . TETANUS/TDAP  11/29/2019  . COLONOSCOPY  04/17/2025  . Hepatitis C Screening  Completed  . HIV Screening  Completed     Discussed health benefits of physical activity, and encouraged her to engage in regular exercise appropriate for her age and condition.    --------------------------------------------------------------------    Mar Daring, PA-C  [place]

## 2018-05-27 ENCOUNTER — Ambulatory Visit
Admission: RE | Admit: 2018-05-27 | Discharge: 2018-05-27 | Disposition: A | Discharge status: Home / Self Care | Payer: 59 | Source: Ambulatory Visit | Attending: Physician Assistant | Admitting: Physician Assistant

## 2018-05-27 ENCOUNTER — Telehealth: Payer: Self-pay

## 2018-05-27 ENCOUNTER — Other Ambulatory Visit: Payer: Self-pay | Admitting: Physician Assistant

## 2018-05-27 DIAGNOSIS — Z1231 Encounter for screening mammogram for malignant neoplasm of breast: Secondary | ICD-10-CM | POA: Diagnosis not present

## 2018-05-27 DIAGNOSIS — F419 Anxiety disorder, unspecified: Secondary | ICD-10-CM

## 2018-05-27 NOTE — Telephone Encounter (Signed)
pt returned call ° °teri ° °

## 2018-05-27 NOTE — Telephone Encounter (Signed)
Patient returned call and was advised. 

## 2018-05-27 NOTE — Telephone Encounter (Signed)
-----   Message from Mar Daring, [place] sent at 05/27/2018 10:49 AM EST ----- Normal mammogram. Repeat screening in one year.

## 2018-05-27 NOTE — Telephone Encounter (Signed)
LVMTRC 

## 2018-06-24 ENCOUNTER — Other Ambulatory Visit: Payer: Self-pay | Admitting: Physician Assistant

## 2018-06-24 DIAGNOSIS — I7300 Raynaud's syndrome without gangrene: Secondary | ICD-10-CM

## 2018-09-09 ENCOUNTER — Other Ambulatory Visit: Payer: Self-pay | Admitting: Physician Assistant

## 2018-09-09 DIAGNOSIS — K2100 Gastro-esophageal reflux disease with esophagitis, without bleeding: Secondary | ICD-10-CM

## 2018-12-01 ENCOUNTER — Other Ambulatory Visit: Payer: Self-pay | Admitting: Physician Assistant

## 2018-12-01 DIAGNOSIS — F419 Anxiety disorder, unspecified: Secondary | ICD-10-CM

## 2018-12-23 ENCOUNTER — Other Ambulatory Visit: Payer: Self-pay | Admitting: Physician Assistant

## 2018-12-23 DIAGNOSIS — E7800 Pure hypercholesterolemia, unspecified: Secondary | ICD-10-CM

## 2019-01-31 ENCOUNTER — Other Ambulatory Visit: Payer: Self-pay | Admitting: Physician Assistant

## 2019-01-31 DIAGNOSIS — I7300 Raynaud's syndrome without gangrene: Secondary | ICD-10-CM

## 2019-04-20 ENCOUNTER — Other Ambulatory Visit: Payer: Self-pay | Admitting: Physician Assistant

## 2019-04-20 DIAGNOSIS — Z1231 Encounter for screening mammogram for malignant neoplasm of breast: Secondary | ICD-10-CM

## 2019-05-05 ENCOUNTER — Other Ambulatory Visit: Payer: Self-pay | Admitting: Physician Assistant

## 2019-05-05 DIAGNOSIS — F419 Anxiety disorder, unspecified: Secondary | ICD-10-CM

## 2019-05-05 DIAGNOSIS — I7300 Raynaud's syndrome without gangrene: Secondary | ICD-10-CM

## 2019-05-10 ENCOUNTER — Other Ambulatory Visit: Payer: Self-pay | Admitting: Physician Assistant

## 2019-05-10 DIAGNOSIS — I7300 Raynaud's syndrome without gangrene: Secondary | ICD-10-CM

## 2019-05-10 NOTE — Telephone Encounter (Signed)
Requested medication (s) are due for refill today:   Yes  Requested medication (s) are on the active medication list:   Yes  Future visit scheduled:   07/03/2019   Last ordered: 02/02/2019  #90  0 refills      Failed protocol.   Forwarded for provider review.   Requested Prescriptions  Pending Prescriptions Disp Refills   NIFEdipine (PROCARDIA-XL/NIFEDICAL-XL) 30 MG 24 hr tablet [Pharmacy Med Name: NIFEDIPINE ER OSMOTIC RELEASE 30 MG] 90 tablet 0    Sig: TAKE 1 TABLET BY MOUTH ONCE DAILY      Cardiovascular:  Calcium Channel Blockers Failed - 05/10/2019  1:21 PM      Failed - Last BP in normal range    BP Readings from Last 1 Encounters:  05/05/17 (!) 166/100          Failed - Valid encounter within last 6 months    Recent Outpatient Visits           2 years ago Annual physical exam   [facility] [place], [place] Sorrel, [place]   3 years ago Annual physical exam   [place], [place], [place]   3 years ago Acute bacterial conjunctivitis of left eye   [facility] [place], [place] Sor[place], [place]   4 years ago Annual physical exam   [facility] [place], [place] Sor[place], [place]   4 years ago [facility], [place], [place]

## 2019-05-29 ENCOUNTER — Other Ambulatory Visit: Payer: Self-pay | Admitting: Physician Assistant

## 2019-05-29 ENCOUNTER — Ambulatory Visit
Admission: RE | Admit: 2019-05-29 | Discharge: 2019-05-29 | Disposition: A | Discharge status: Home / Self Care | Payer: 59 | Source: Ambulatory Visit | Attending: Physician Assistant | Admitting: Physician Assistant

## 2019-05-29 ENCOUNTER — Telehealth: Payer: Self-pay

## 2019-05-29 DIAGNOSIS — Z1231 Encounter for screening mammogram for malignant neoplasm of breast: Secondary | ICD-10-CM | POA: Diagnosis present

## 2019-05-29 DIAGNOSIS — E7800 Pure hypercholesterolemia, unspecified: Secondary | ICD-10-CM

## 2019-05-29 NOTE — Telephone Encounter (Signed)
Requested medication (s) are due for refill today: yes  Requested medication (s) are on the active medication list: yes  Last refill:  12/23/18  Future visit scheduled: no  Notes to clinic:  Pt > 3 months overdue and no future visit scheduled   Requested Prescriptions  Pending Prescriptions Disp Refills   atorvastatin (LIPITOR) 10 MG tablet [Pharmacy Med Name: ATORVASTATIN CALCIUM 10 MG TAB] 90 tablet 1    Sig: TAKE 1 TABLET BY MOUTH ONCE DAILY      Cardiovascular:  Antilipid - Statins Failed - 05/29/2019  9:46 AM      Failed - Total Cholesterol in normal range and within 360 days    Cholesterol, Total  Date Value Ref Range Status  05/05/2017 265 (H) 100 - 199 mg/dL Final          Failed - LDL in normal range and within 360 days    LDL Calculated  Date Value Ref Range Status  05/05/2017 176 (H) 0 - 99 mg/dL Final          Failed - HDL in normal range and within 360 days    HDL  Date Value Ref Range Status  05/05/2017 54 >39 mg/dL Final          Failed - Triglycerides in normal range and within 360 days    Triglycerides  Date Value Ref Range Status  05/05/2017 174 (H) 0 - 149 mg/dL Final          Failed - Valid encounter within last 12 months    Recent Outpatient Visits           2 years ago Annual physical exam   [facility] [place], [place] Sor[place], [place]   3 years ago Annual physical exam   [facility] [place], [place]   3 years ago Acute bacterial conjunctivitis of left eye   [place], Clearnce Sorrel, [place]   4 years ago Annual physical exam   [place], Clearnce Sorrel, [place]   4 years ago [place], [place], [place] - Patient is not pregnant

## 2019-05-29 NOTE — Telephone Encounter (Signed)
Patient was advised.  

## 2019-05-29 NOTE — Telephone Encounter (Signed)
-----   Message from Mar Daring, [place] sent at 05/29/2019 10:06 AM EST ----- Normal mammogram. Repeat screening in one year.

## 2019-06-05 ENCOUNTER — Other Ambulatory Visit: Payer: Self-pay | Admitting: Physician Assistant

## 2019-06-05 DIAGNOSIS — F419 Anxiety disorder, unspecified: Secondary | ICD-10-CM

## 2019-06-05 NOTE — Telephone Encounter (Signed)
Refill request for ESCITALOPRAM OXALATE 10 MG TAB. Pt needs a PHQ-2 or a PHQ-9. NOV 07/13/19 LR 12/01/18

## 2019-06-30 NOTE — Progress Notes (Signed)
Complete physical exam   Patient: Dawn Adkins   DOB: Jun 25, 1955   65 y.o. Female  MRN: [id number] Visit Date: 07/03/2019  Today's healthcare provider: Mar Daring, PA-C    Subjective:    Chief Complaint  Patient presents with  . Annual Exam    Dawn Adkins is a 64 y.o. female who presents today for a complete physical exam. Patient is doing well. She is exercising some.  Does have a few acute complaints.    Has been having pain in her lateral hips bilaterally and her left shoulder. Reports pain is mostly at night when she tries to lie on either side. Has been having to sleep on her back. No numbness or weakness.  Has noticed some pelvic pressure and a soft tissue mass that prolapses out of the vagina that she has to push back in. Does not happen all the time but occasionally after urinating.   Also has chronic constipation. May go once or twice per week. Not uncommon to not go for a whole week. Has had since she was a kid. NO changes. Does use laxative OTC if needed. Had to use two last week. Denies any melena or hematochezia. Does try to stay well hydrated and eats dietary fibers.    Past Medical History:  Diagnosis Date  . Anxiety   . Depression   . GERD (gastroesophageal reflux disease)   . Hyperlipidemia   . Motion sickness    back seat - car   Past Surgical History:  Procedure Laterality Date  . COLONOSCOPY  2011  . COLONOSCOPY WITH PROPOFOL N/A 04/18/2015   Procedure: COLONOSCOPY WITH PROPOFOL;  Surgeon: Lucilla Lame, MD;  Location: [place];  Service: Endoscopy;  Laterality: N/A;  . POLYPECTOMY  04/18/2015   Procedure: POLYPECTOMY;  Surgeon: Lucilla Lame, MD;  Location: [place];  Service: Endoscopy;;  . TUBAL LIGATION  1984   Social History   Socioeconomic History  . Marital status: Married    Spouse name: Celvin  . Number of children: 3  . Years of education: Not on file  . Highest education level: Not on file    Occupational History  . Occupation: part itme  Tobacco Use  . Smoking status: Never Smoker  . Smokeless tobacco: Never Used  Substance and Sexual Activity  . Alcohol use: Yes    Alcohol/week: 4.0 standard drinks    Types: 4 Cans of beer per week    Comment: OCCASIONALLY  . Drug use: No  . Sexual activity: Not on file  Other Topics Concern  . Not on file  Social History Narrative  . Not on file   Social Determinants of Health   Financial Resource Strain:   . Difficulty of Paying Living Expenses:   Food Insecurity:   . Worried About Charity fundraiser in the Last Year:   . Arboriculturist in the Last Year:   Transportation Needs:   . Film/video editor (Medical):   Marland Kitchen Lack of Transportation (Non-Medical):   Physical Activity:   . Days of Exercise per Week:   . Minutes of Exercise per Session:   Stress:   . Feeling of Stress :   Social Connections:   . Frequency of Communication with Friends and Family:   . Frequency of Social Gatherings with Friends and Family:   . Attends Religious Services:   . Active Member of Clubs or Organizations:   . Attends Archivist Meetings:   .  Marital Status:   Intimate Partner Violence:   . Fear of Current or Ex-Partner:   . Emotionally Abused:   Marland Kitchen Physically Abused:   . Sexually Abused:    Family Status  Relation Name Status  . Mother  Deceased  . Son 1 Alive  . Father  Deceased  . Brother 1 Alive  . Brother 2 Alive  . Brother 3 Alive  . Son 2 Alive  . Daughter  Alive  . Neg Hx  (Not Specified)   Family History  Problem Relation Age of Onset  . Cervical cancer Mother   . Hypertension Mother   . Heart disease Mother   . Diabetes Son   . Mental illness Son   . Seizures Brother 21       fell from a 2 story building  . Seizures Brother   . Healthy Son   . Healthy Daughter   . Breast cancer Neg Hx    No Known Allergies  Patient Care Team: Rubye Beach as PCP - General (Physician Assistant)    Medications: Outpatient Medications Prior to Visit  Medication Sig  . atorvastatin (LIPITOR) 10 MG tablet TAKE 1 TABLET BY MOUTH ONCE DAILY.  . Ergocalciferol (VITAMIN D2) 2000 UNITS TABS Take 1 tablet by mouth daily.  Marland Kitchen escitalopram (LEXAPRO) 10 MG tablet TAKE 1 TABLET BY MOUTH ONCE DAILY  . NIFEdipine (PROCARDIA-XL/NIFEDICAL-XL) 30 MG 24 hr tablet TAKE 1 TABLET BY MOUTH ONCE DAILY  . pantoprazole (PROTONIX) 40 MG tablet TAKE 1 TABLET BY MOUTH A DAY  . traZODone (DESYREL) 50 MG tablet TAKE 1/2 TO 1 TABLET BY MOUTH DAILY.  Marland Kitchen aspirin 81 MG tablet Take 81 mg by mouth daily.  . methylPREDNISolone (MEDROL) 4 MG TBPK tablet 6 day taper; take as directed on package instructions   No facility-administered medications prior to visit.    Review of Systems  Constitutional: Negative.   HENT: Negative.   Eyes: Negative.   Respiratory: Negative.   Cardiovascular: Negative.   Gastrointestinal: Positive for constipation.  Endocrine: Negative.   Genitourinary: Negative.   Musculoskeletal: Positive for arthralgias.  Skin: Negative.   Allergic/Immunologic: Negative.   Neurological: Negative.   Hematological: Negative.   Psychiatric/Behavioral: Negative.         Objective:    BP 135/77 (BP Location: Left Arm, Patient Position: Sitting, Cuff Size: Large)   Pulse (!) 55   Temp (!) 96.9 F (36.1 C) (Temporal)   Resp 16   Ht 4\' 11"  (1.499 m)   Wt 137 lb (62.1 kg)   BMI 27.67 kg/m    Physical Exam Vitals reviewed. Exam conducted with a chaperone present.  Constitutional:      General: She is not in acute distress.    Appearance: Normal appearance. She is well-developed and overweight. She is not diaphoretic.  HENT:     Head: Normocephalic and atraumatic.     Right Ear: Hearing, tympanic membrane and external ear normal.     Left Ear: Hearing, tympanic membrane, ear canal and external ear normal.     Ears:     Comments: Right ear canal was erythematous and a little swollen     Mouth/Throat:     Pharynx: Uvula midline.  Eyes:     General: No scleral icterus.       Right eye: No discharge.        Left eye: No discharge.     Extraocular Movements: Extraocular movements intact.     Conjunctiva/sclera: Conjunctivae normal.  Pupils: Pupils are equal, round, and reactive to light.  Neck:     Thyroid: No thyromegaly.     Vascular: No carotid bruit or JVD.     Trachea: No tracheal deviation.  Cardiovascular:     Rate and Rhythm: Normal rate and regular rhythm.     Pulses: Normal pulses.     Heart sounds: Normal heart sounds. No murmur. No friction rub. No gallop.   Pulmonary:     Effort: Pulmonary effort is normal. No respiratory distress.     Breath sounds: Normal breath sounds. No wheezing or rales.  Chest:     Chest wall: No tenderness.     Breasts: Breasts are symmetrical.        Right: No inverted nipple, mass, nipple discharge, skin change or tenderness.        Left: No inverted nipple, mass, nipple discharge, skin change or tenderness.  Abdominal:     General: Abdomen is flat. Bowel sounds are normal. There is no distension.     Palpations: Abdomen is soft. There is no mass.     Tenderness: There is no abdominal tenderness. There is no guarding or rebound.     Hernia: No hernia is present. There is no hernia in the left inguinal area or right inguinal area.  Genitourinary:    General: Normal vulva.     Exam position: Supine.     Labia:        Right: No rash, tenderness, lesion or injury.        Left: No rash, tenderness, lesion or injury.      Urethra: No prolapse.     Vagina: Normal. No signs of injury. No vaginal discharge, erythema, tenderness, bleeding or prolapsed vaginal walls (there is some redundant vaginal tissue on the right side at the vaginal os that i feel is most likely the tissue she feels prolapse occasionally).     Cervix: Normal.     Uterus: Normal.      Adnexa: Right adnexa normal and left adnexa normal.       Right: No mass,  tenderness or fullness.         Left: No mass, tenderness or fullness.       Rectum: Normal.  Musculoskeletal:        General: No tenderness. Normal range of motion.     Cervical back: Normal range of motion and neck supple.     Right lower leg: No edema.     Left lower leg: No edema.  Lymphadenopathy:     Cervical: No cervical adenopathy.     Lower Body: No right inguinal adenopathy.  Skin:    General: Skin is warm and dry.     Capillary Refill: Capillary refill takes less than 2 seconds.     Findings: No rash.  Neurological:     General: No focal deficit present.     Mental Status: She is alert and oriented to person, place, and time. Mental status is at baseline.     Cranial Nerves: No cranial nerve deficit.     Motor: No weakness.     Coordination: Coordination normal.     Gait: Gait normal.     Deep Tendon Reflexes: Reflexes are normal and symmetric.  Psychiatric:        Mood and Affect: Mood normal.        Behavior: Behavior normal. Behavior is cooperative.        Thought Content: Thought content normal.  Judgment: Judgment normal.     Depression Screen  PHQ 2/9 Scores 07/03/2019 05/05/2017 04/13/2016  PHQ - 2 Score 0 1 0  PHQ- 9 Score - 4 -    No results found for any visits on 07/03/19.    Assessment & Plan:    Routine Health Maintenance and Physical Exam  Exercise Activities and Dietary recommendations Goals   None     Immunization History  Administered Date(s) Administered  . Influenza Inj Mdck Quad With Preservative 03/15/2017  . Influenza Split 12/19/2010, 12/22/2011  . Influenza,inj,Quad PF,6+ Mos 01/24/2013, 01/26/2014, 03/14/2015  . Td 05/29/2004  . Tdap 11/28/2009  . Zoster Recombinat (Shingrix) 05/05/2017, 07/05/2017    Health Maintenance  Topic Date Due  . PAP SMEAR-Modifier  04/10/2018  . INFLUENZA VACCINE  10/29/2019  . TETANUS/TDAP  11/29/2019  . MAMMOGRAM  05/28/2021  . COLONOSCOPY  04/17/2025  . Hepatitis C Screening  Completed    . HIV Screening  Completed    Discussed health benefits of physical activity, and encouraged her to engage in regular exercise appropriate for her age and condition.  1. Encounter for annual health examination Normal physical exam today. Will check labs as below and f/u pending lab results. If labs are stable and WNL she will not need to have these rechecked for one year at her next annual physical exam. She is to call the office in the meantime if she has any acute issue, questions or concerns. - CBC with Differential/Platelet - Comprehensive metabolic panel - Lipid panel - TSH  2. Hypercholesterolemia without hypertriglyceridemia Diet controlled. Will check labs as below and f/u pending results. - Lipid panel  3. Encounter for screening for cervical cancer  Pap collected today. Will send as below and f/u pending results. - Cytology - PAP  4. BMI 27.0-27.9,adult Counseled patient on healthy lifestyle modifications including dieting and exercise.  - CBC with Differential/Platelet - Comprehensive metabolic panel  5. Slow transit constipation Discussed adding a probiotic, consider adding fiber supplement. Could also use Miralax daily instead of other laxatives prn.   6. Pelvic pressure in female Redundant vaginal tissue. Kegel exercises provided. Discussed pelvic floor therapy.   7. Bursitis of left shoulder Noted. Will try voltaren gel OTC first and exercises. Call if not improving.   8. Trochanteric bursitis of both hips Noted bilaterally. Will try Voltaren gel OTC and will call if not improving.   9. Irritation of external ear canal, right Advised to try Tea Tree oil OTC for irritation.       Rubye Beach  [facility] [phone] (phone) [phone] (fax)  [place]

## 2019-07-03 ENCOUNTER — Other Ambulatory Visit (HOSPITAL_COMMUNITY)
Admission: RE | Admit: 2019-07-03 | Discharge: 2019-07-03 | Disposition: A | Discharge status: Home / Self Care | Payer: 59 | Source: Ambulatory Visit | Attending: Physician Assistant | Admitting: Physician Assistant

## 2019-07-03 ENCOUNTER — Ambulatory Visit (INDEPENDENT_AMBULATORY_CARE_PROVIDER_SITE_OTHER): Payer: 59 | Admitting: Physician Assistant

## 2019-07-03 ENCOUNTER — Encounter: Payer: Self-pay | Admitting: Physician Assistant

## 2019-07-03 ENCOUNTER — Other Ambulatory Visit: Payer: Self-pay

## 2019-07-03 VITALS — BP 135/77 | HR 55 | Temp 96.9°F | Resp 16 | Ht 59.0 in | Wt 137.0 lb

## 2019-07-03 DIAGNOSIS — M7552 Bursitis of left shoulder: Secondary | ICD-10-CM

## 2019-07-03 DIAGNOSIS — M7061 Trochanteric bursitis, right hip: Secondary | ICD-10-CM | POA: Diagnosis not present

## 2019-07-03 DIAGNOSIS — Z0000 Encounter for general adult medical examination without abnormal findings: Secondary | ICD-10-CM

## 2019-07-03 DIAGNOSIS — E7800 Pure hypercholesterolemia, unspecified: Secondary | ICD-10-CM

## 2019-07-03 DIAGNOSIS — K5901 Slow transit constipation: Secondary | ICD-10-CM

## 2019-07-03 DIAGNOSIS — R102 Pelvic and perineal pain: Secondary | ICD-10-CM | POA: Diagnosis not present

## 2019-07-03 DIAGNOSIS — Z6827 Body mass index (BMI) 27.0-27.9, adult: Secondary | ICD-10-CM

## 2019-07-03 DIAGNOSIS — Z124 Encounter for screening for malignant neoplasm of cervix: Secondary | ICD-10-CM | POA: Insufficient documentation

## 2019-07-03 DIAGNOSIS — H61891 Other specified disorders of right external ear: Secondary | ICD-10-CM

## 2019-07-03 DIAGNOSIS — M7062 Trochanteric bursitis, left hip: Secondary | ICD-10-CM

## 2019-07-03 NOTE — Patient Instructions (Addendum)
Tea tree oil-ear irritation Voltaren gel-for hips and left shoulder Probiotics (Align) for constipation; could also consider adding fiber (benefiber, fiber-con, metamucil)   Health Maintenance, Female Adopting a healthy lifestyle and getting preventive care are important in promoting health and wellness. Ask your health care provider about:  The right schedule for you to have regular tests and exams.  Things you can do on your own to prevent diseases and keep yourself healthy. What should I know about diet, weight, and exercise? Eat a healthy diet   Eat a diet that includes plenty of vegetables, fruits, low-fat dairy products, and lean protein.  Do not eat a lot of foods that are high in solid fats, added sugars, or sodium. Maintain a healthy weight Body mass index (BMI) is used to identify weight problems. It estimates body fat based on height and weight. Your health care provider can help determine your BMI and help you achieve or maintain a healthy weight. Get regular exercise Get regular exercise. This is one of the most important things you can do for your health. Most adults should:  Exercise for at least 150 minutes each week. The exercise should increase your heart rate and make you sweat (moderate-intensity exercise).  Do strengthening exercises at least twice a week. This is in addition to the moderate-intensity exercise.  Spend less time sitting. Even light physical activity can be beneficial. Watch cholesterol and blood lipids Have your blood tested for lipids and cholesterol at 62 years of age, then have this test every 5 years. Have your cholesterol levels checked more often if:  Your lipid or cholesterol levels are high.  You are older than 62 years of age.  You are at high risk for heart disease. What should I know about cancer screening? Depending on your health history and family history, you may need to have cancer screening at various ages. This may include  screening for:  Breast cancer.  Cervical cancer.  Colorectal cancer.  Skin cancer.  Lung cancer. What should I know about heart disease, diabetes, and high blood pressure? Blood pressure and heart disease  High blood pressure causes heart disease and increases the risk of stroke. This is more likely to develop in people who have high blood pressure readings, are of African descent, or are overweight.  Have your blood pressure checked: ? Every 3-5 years if you are 29-75 years of age. ? Every year if you are 62 years old or older. Diabetes Have regular diabetes screenings. This checks your fasting blood sugar level. Have the screening done:  Once every three years after age 32 if you are at a normal weight and have a low risk for diabetes.  More often and at a younger age if you are overweight or have a high risk for diabetes. What should I know about preventing infection? Hepatitis B If you have a higher risk for hepatitis B, you should be screened for this virus. Talk with your health care provider to find out if you are at risk for hepatitis B infection. Hepatitis C Testing is recommended for:  Everyone born from [birth year] through 1963.  Anyone with known risk factors for hepatitis C. Sexually transmitted infections (STIs)  Get screened for STIs, including gonorrhea and chlamydia, if: ? You are sexually active and are younger than 63 years of age. ? You are older than 62 years of age and your health care provider tells you that you are at risk for this type of infection. ? Your sexual  activity has changed since you were last screened, and you are at increased risk for chlamydia or gonorrhea. Ask your health care provider if you are at risk.  Ask your health care provider about whether you are at high risk for HIV. Your health care provider may recommend a prescription medicine to help prevent HIV infection. If you choose to take medicine to prevent HIV, you should first get  tested for HIV. You should then be tested every 3 months for as long as you are taking the medicine. Pregnancy  If you are about to stop having your period (premenopausal) and you may become pregnant, seek counseling before you get pregnant.  Take 400 to 800 micrograms (mcg) of folic acid every day if you become pregnant.  Ask for birth control (contraception) if you want to prevent pregnancy. Osteoporosis and menopause Osteoporosis is a disease in which the bones lose minerals and strength with aging. This can result in bone fractures. If you are 52 years old or older, or if you are at risk for osteoporosis and fractures, ask your health care provider if you should:  Be screened for bone loss.  Take a calcium or vitamin D supplement to lower your risk of fractures.  Be given hormone replacement therapy (HRT) to treat symptoms of menopause. Follow these instructions at home: Lifestyle  Do not use any products that contain nicotine or tobacco, such as cigarettes, e-cigarettes, and chewing tobacco. If you need help quitting, ask your health care provider.  Do not use street drugs.  Do not share needles.  Ask your health care provider for help if you need support or information about quitting drugs. Alcohol use  Do not drink alcohol if: ? Your health care provider tells you not to drink. ? You are pregnant, may be pregnant, or are planning to become pregnant.  If you drink alcohol: ? Limit how much you use to 0-1 drink a day. ? Limit intake if you are breastfeeding.  Be aware of how much alcohol is in your drink. In the [place] one drink equals one 12 oz bottle of beer (355 mL), one 5 oz glass of wine (148 mL), or one 1 oz glass of hard liquor (44 mL). General instructions  Schedule regular health, dental, and eye exams.  Stay current with your vaccines.  Tell your health care provider if: ? You often feel depressed. ? You have ever been abused or do not feel safe at  home. Summary  Adopting a healthy lifestyle and getting preventive care are important in promoting health and wellness.  Follow your health care provider's instructions about healthy diet, exercising, and getting tested or screened for diseases.  Follow your health care provider's instructions on monitoring your cholesterol and blood pressure. This information is not intended to replace advice given to you by your health care provider. Make sure you discuss any questions you have with your health care provider. Document Revised: 03/09/2018 Document Reviewed: 03/09/2018 Elsevier Patient Education  [place].   Kegel Exercises  Kegel exercises can help strengthen your pelvic floor muscles. The pelvic floor is a group of muscles that support your rectum, small intestine, and bladder. In females, pelvic floor muscles also help support the womb (uterus). These muscles help you control the flow of urine and stool. Kegel exercises are painless and simple, and they do not require any equipment. Your provider may suggest Kegel exercises to:  Improve bladder and bowel control.  Improve sexual response.  Improve weak  pelvic floor muscles after surgery to remove the uterus (hysterectomy) or pregnancy (females).  Improve weak pelvic floor muscles after prostate gland removal or surgery (males). Kegel exercises involve squeezing your pelvic floor muscles, which are the same muscles you squeeze when you try to stop the flow of urine or keep from passing gas. The exercises can be done while sitting, standing, or lying down, but it is best to vary your position. Exercises How to do Kegel exercises: 1. Squeeze your pelvic floor muscles tight. You should feel a tight lift in your rectal area. If you are a female, you should also feel a tightness in your vaginal area. Keep your stomach, buttocks, and legs relaxed. 2. Hold the muscles tight for up to 10 seconds. 3. Breathe normally. 4. Relax your  muscles. 5. Repeat as told by your health care provider. Repeat this exercise daily as told by your health care provider. Continue to do this exercise for at least 4-6 weeks, or for as long as told by your health care provider. You may be referred to a physical therapist who can help you learn more about how to do Kegel exercises. Depending on your condition, your health care provider may recommend:  Varying how long you squeeze your muscles.  Doing several sets of exercises every day.  Doing exercises for several weeks.  Making Kegel exercises a part of your regular exercise routine. This information is not intended to replace advice given to you by your health care provider. Make sure you discuss any questions you have with your health care provider. Document Revised: 11/03/2017 Document Reviewed: 11/03/2017 Elsevier Patient Education  [place].

## 2019-07-04 ENCOUNTER — Telehealth: Payer: Self-pay

## 2019-07-04 LAB — CBC WITH DIFFERENTIAL/PLATELET
Basophils Absolute: 0.1 10*3/uL (ref 0.0–0.2)
Basos: 1 %
EOS (ABSOLUTE): 0.1 10*3/uL (ref 0.0–0.4)
Eos: 2 %
Hematocrit: 38.7 % (ref 34.0–46.6)
Hemoglobin: 13 g/dL (ref 11.1–15.9)
Immature Grans (Abs): 0 10*3/uL (ref 0.0–0.1)
Immature Granulocytes: 0 %
Lymphocytes Absolute: 2.8 10*3/uL (ref 0.7–3.1)
Lymphs: 31 %
MCH: 29.4 pg (ref 26.6–33.0)
MCHC: 33.6 g/dL (ref 31.5–35.7)
MCV: 88 fL (ref 79–97)
Monocytes Absolute: 0.6 10*3/uL (ref 0.1–0.9)
Monocytes: 7 %
Neutrophils Absolute: 5.4 10*3/uL (ref 1.4–7.0)
Neutrophils: 59 %
Platelets: 287 10*3/uL (ref 150–450)
RBC: 4.42 x10E6/uL (ref 3.77–5.28)
RDW: 13.1 % (ref 11.7–15.4)
WBC: 9 10*3/uL (ref 3.4–10.8)

## 2019-07-04 LAB — COMPREHENSIVE METABOLIC PANEL
ALT: 16 IU/L (ref 0–32)
AST: 19 IU/L (ref 0–40)
Albumin/Globulin Ratio: 2.1 (ref 1.2–2.2)
Albumin: 4.5 g/dL (ref 3.8–4.8)
Alkaline Phosphatase: 84 IU/L (ref 39–117)
BUN/Creatinine Ratio: 17 (ref 12–28)
BUN: 13 mg/dL (ref 8–27)
Bilirubin Total: 0.3 mg/dL (ref 0.0–1.2)
CO2: 21 mmol/L (ref 20–29)
Calcium: 9.3 mg/dL (ref 8.7–10.3)
Chloride: 103 mmol/L (ref 96–106)
Creatinine, Ser: 0.76 mg/dL (ref 0.57–1.00)
GFR calc Af Amer: 98 mL/min/{1.73_m2} (ref >59)
GFR calc non Af Amer: 85 mL/min/{1.73_m2} (ref >59)
Globulin, Total: 2.1 g/dL (ref 1.5–4.5)
Glucose: 90 mg/dL (ref 65–99)
Potassium: 4.3 mmol/L (ref 3.5–5.2)
Sodium: 139 mmol/L (ref 134–144)
Total Protein: 6.6 g/dL (ref 6.0–8.5)

## 2019-07-04 LAB — CYTOLOGY - PAP
Comment: NEGATIVE
Diagnosis: NEGATIVE
High risk HPV: NEGATIVE

## 2019-07-04 LAB — TSH: TSH: 2.12 u[IU]/mL (ref 0.450–4.500)

## 2019-07-04 LAB — LIPID PANEL
Chol/HDL Ratio: 4.1 ratio (ref 0.0–4.4)
Cholesterol, Total: 187 mg/dL (ref 100–199)
HDL: 46 mg/dL (ref >39)
LDL Chol Calc (NIH): 119 mg/dL — ABNORMAL HIGH (ref 0–99)
Triglycerides: 122 mg/dL (ref 0–149)
VLDL Cholesterol Cal: 22 mg/dL (ref 5–40)

## 2019-07-04 NOTE — Telephone Encounter (Signed)
-----   Message from Mar Daring, [place] sent at 07/04/2019 10:57 AM EDT ----- Blood count is normal. Sugar is normal. Kidney and liver function are normal. Sodium, potassium and calcium are normal. Cholesterol is much improved to labs 2 years ago and now normal range. Thyroid is normal. Pap is still pending.

## 2019-07-04 NOTE — Telephone Encounter (Signed)
-----   Message from Mar Daring, [place] sent at 07/04/2019  2:07 PM EDT ----- Pap is normal, negative for HPV. Repeat in 5 years.

## 2019-07-04 NOTE — Telephone Encounter (Signed)
Pt advised.   Thanks,   -Sherina Stammer  

## 2019-07-29 ENCOUNTER — Other Ambulatory Visit: Payer: Self-pay | Admitting: Physician Assistant

## 2019-07-29 DIAGNOSIS — E7800 Pure hypercholesterolemia, unspecified: Secondary | ICD-10-CM

## 2019-07-29 NOTE — Telephone Encounter (Signed)
Pt has BW and CPE < 1 month ago (07/03/19) Requested Prescriptions  Pending Prescriptions Disp Refills  . atorvastatin (LIPITOR) 10 MG tablet [Pharmacy Med Name: ATORVASTATIN CALCIUM 10 MG TAB] 30 tablet 1    Sig: TAKE 1 TABLET BY MOUTH ONCE DAILY.     Cardiovascular:  Antilipid - Statins Failed - 07/29/2019 12:48 PM      Failed - LDL in normal range and within 360 days    LDL Chol Calc (NIH)  Date Value Ref Range Status  07/03/2019 119 (H) 0 - 99 mg/dL Final         Failed - Valid encounter within last 12 months    Recent Outpatient Visits          3 weeks ago Encounter for annual health examination   [facility] [place], Anderson Malta M, [place]   2 years ago Annual physical exam   [facility] Fenton Malling M, [place]   3 years ago Annual physical exam   [facility] Fenton Malling M, [place]   3 years ago Acute bacterial conjunctivitis of left eye   [facility] [place], Clearnce Sorrel, [place]   4 years ago Annual physical exam   [facility] Fenton Malling M, [place]             Passed - Total Cholesterol in normal range and within 360 days    Cholesterol, Total  Date Value Ref Range Status  07/03/2019 187 100 - 199 mg/dL Final         Passed - HDL in normal range and within 360 days    HDL  Date Value Ref Range Status  07/03/2019 46 >39 mg/dL Final         Passed - Triglycerides in normal range and within 360 days    Triglycerides  Date Value Ref Range Status  07/03/2019 122 0 - 149 mg/dL Final         Passed - Patient is not pregnant

## 2019-08-10 ENCOUNTER — Other Ambulatory Visit: Payer: Self-pay | Admitting: Physician Assistant

## 2019-08-10 DIAGNOSIS — I7300 Raynaud's syndrome without gangrene: Secondary | ICD-10-CM

## 2019-08-22 ENCOUNTER — Other Ambulatory Visit: Payer: Self-pay

## 2019-08-22 ENCOUNTER — Ambulatory Visit (INDEPENDENT_AMBULATORY_CARE_PROVIDER_SITE_OTHER): Payer: 59 | Admitting: Dermatology

## 2019-08-22 DIAGNOSIS — D2272 Melanocytic nevi of left lower limb, including hip: Secondary | ICD-10-CM | POA: Diagnosis not present

## 2019-08-22 DIAGNOSIS — D2262 Melanocytic nevi of left upper limb, including shoulder: Secondary | ICD-10-CM | POA: Diagnosis not present

## 2019-08-22 DIAGNOSIS — D229 Melanocytic nevi, unspecified: Secondary | ICD-10-CM

## 2019-08-22 DIAGNOSIS — I781 Nevus, non-neoplastic: Secondary | ICD-10-CM

## 2019-08-22 DIAGNOSIS — L820 Inflamed seborrheic keratosis: Secondary | ICD-10-CM

## 2019-08-22 DIAGNOSIS — Z1283 Encounter for screening for malignant neoplasm of skin: Secondary | ICD-10-CM | POA: Diagnosis not present

## 2019-08-22 DIAGNOSIS — L821 Other seborrheic keratosis: Secondary | ICD-10-CM

## 2019-08-22 DIAGNOSIS — L578 Other skin changes due to chronic exposure to nonionizing radiation: Secondary | ICD-10-CM

## 2019-08-22 DIAGNOSIS — D1801 Hemangioma of skin and subcutaneous tissue: Secondary | ICD-10-CM

## 2019-08-22 DIAGNOSIS — L814 Other melanin hyperpigmentation: Secondary | ICD-10-CM

## 2019-08-22 NOTE — Patient Instructions (Addendum)
Recommend daily broad spectrum sunscreen SPF 30+ to sun-exposed areas, reapply every 2 hours as needed. Call for new or changing lesions.  Cryotherapy Aftercare  . Wash gently with soap and water everyday.   . Apply Vaseline and Band-Aid daily until healed.  Prior to procedure, discussed risks of blister formation, small wound, skin dyspigmentation, or rare scar following cryotherapy.   

## 2019-08-22 NOTE — Progress Notes (Signed)
   Follow-Up Visit   Subjective  Dawn Adkins is a 64 y.o. female who presents for the following: TBSE (No concerng spots today). No Skin Cancer History.   She has spot on breast that changed and is irritated   The following portions of the chart were reviewed this encounter and updated as appropriate:      Review of Systems:  No other skin or systemic complaints except as noted in HPI or Assessment and Plan.  Objective  Well appearing patient in no apparent distress; mood and affect are within normal limits.  A full examination was performed including scalp, head, eyes, ears, nose, lips, neck, chest, axillae, abdomen, back, buttocks, bilateral upper extremities, bilateral lower extremities, hands, feet, fingers, toes, fingernails, and toenails. All findings within normal limits unless otherwise noted below.  Objective  Right Medial Breast: Erythematous keratotic or waxy stuck-on papule.   Objective  Left Shoulder - Posterior: 31mm flesh papule  Left  Medial Thigh - Posterior: 2.5 mm med brown macule   Assessment & Plan  Inflamed seborrheic keratosis Right Medial Breast  Cryotherapy today Prior to procedure, discussed risks of blister formation, small wound, skin dyspigmentation, or rare scar following cryotherapy.    Destruction of lesion - Right Medial Breast  Destruction method: cryotherapy   Informed consent: discussed and consent obtained   Lesion destroyed using liquid nitrogen: Yes   Region frozen until ice ball extended beyond lesion: Yes   Outcome: patient tolerated procedure well with no complications   Post-procedure details: wound care instructions given    Nevus (2) Left Shoulder - Posterior; Left  Medial Thigh - Posterior  Benign-appearing.  Observation.  Call clinic for new or changing moles.  Recommend daily use of broad spectrum spf 30+ sunscreen to sun-exposed areas.    Lentigines - Scattered tan macules - Discussed due to sun exposure -  Benign, observe - Call for any changes  Seborrheic Keratoses - Stuck-on, waxy, tan-brown papules and plaques  - Discussed benign etiology and prognosis. - Observe - Call for any changes  Melanocytic Nevi - Tan-brown and/or pink-flesh-colored symmetric macules and papules - Benign appearing on exam today - Observation - Call clinic for new or changing moles - Recommend daily use of broad spectrum spf 30+ sunscreen to sun-exposed areas.   Hemangiomas - Red papules - Discussed benign nature - Observe - Call for any changes  Actinic Damage - diffuse scaly erythematous macules with underlying dyspigmentation - Recommend daily broad spectrum sunscreen SPF 30+ to sun-exposed areas, reapply every 2 hours as needed.  - Call for new or changing lesions.  Telangiectasia - Dilated blood vessels legs - Benign appearing on exam - Call for changes   Skin cancer screening performed today.   Return in about 1 year (around 08/21/2020) for TBSE.  I, Donzetta Kohut, CMA, am acting as scribe for Brendolyn Patty, MD .  Documentation: I have reviewed the above documentation for accuracy and completeness, and I agree with the above.  Brendolyn Patty MD

## 2019-09-14 ENCOUNTER — Other Ambulatory Visit: Payer: Self-pay | Admitting: Physician Assistant

## 2019-09-14 DIAGNOSIS — K2100 Gastro-esophageal reflux disease with esophagitis, without bleeding: Secondary | ICD-10-CM

## 2019-10-16 ENCOUNTER — Other Ambulatory Visit: Payer: Self-pay | Admitting: Physician Assistant

## 2019-10-16 DIAGNOSIS — I7300 Raynaud's syndrome without gangrene: Secondary | ICD-10-CM

## 2019-10-27 ENCOUNTER — Other Ambulatory Visit: Payer: Self-pay | Admitting: Physician Assistant

## 2019-10-27 DIAGNOSIS — F5101 Primary insomnia: Secondary | ICD-10-CM

## 2019-12-03 ENCOUNTER — Other Ambulatory Visit: Payer: Self-pay | Admitting: Physician Assistant

## 2019-12-03 DIAGNOSIS — F419 Anxiety disorder, unspecified: Secondary | ICD-10-CM

## 2019-12-03 NOTE — Telephone Encounter (Signed)
Requested Prescriptions  Pending Prescriptions Disp Refills  . escitalopram (LEXAPRO) 10 MG tablet [Pharmacy Med Name: ESCITALOPRAM OXALATE 10 MG TAB] 90 tablet 0    Sig: TAKE 1 TABLET BY MOUTH ONCE DAILY     Psychiatry:  Antidepressants - SSRI Passed - 12/03/2019  1:27 PM      Passed - Completed PHQ-2 or PHQ-9 in the last 360 days.      Passed - Valid encounter within last 6 months    Recent Outpatient Visits          5 months ago Encounter for annual health examination   [place], Clearnce Sorrel, [place]   2 years ago Annual physical exam   [facility] [place], Clearnce Sorrel, [place]   3 years ago Annual physical exam   [facility] [place], [place]   3 years ago Acute bacterial conjunctivitis of left eye   [facility], Clearnce Sorrel, [place]   4 years ago Annual physical exam   [place], [place]      Future Appointments            In 7 months Marlyn Corporal, [place] Sorrel, PA-C [employer], [place]           '

## 2020-02-09 ENCOUNTER — Other Ambulatory Visit: Payer: Self-pay | Admitting: Physician Assistant

## 2020-02-09 DIAGNOSIS — I7300 Raynaud's syndrome without gangrene: Secondary | ICD-10-CM

## 2020-03-05 ENCOUNTER — Other Ambulatory Visit: Payer: Self-pay | Admitting: Physician Assistant

## 2020-03-05 DIAGNOSIS — F419 Anxiety disorder, unspecified: Secondary | ICD-10-CM

## 2020-03-05 NOTE — Telephone Encounter (Signed)
Courtesy refill. Pt has appt 07/04/20

## 2020-04-22 ENCOUNTER — Other Ambulatory Visit: Payer: Self-pay | Admitting: Physician Assistant

## 2020-05-15 ENCOUNTER — Other Ambulatory Visit: Payer: Self-pay | Admitting: Physician Assistant

## 2020-05-15 DIAGNOSIS — I7300 Raynaud's syndrome without gangrene: Secondary | ICD-10-CM

## 2020-06-04 ENCOUNTER — Other Ambulatory Visit: Payer: Self-pay | Admitting: Physician Assistant

## 2020-06-04 DIAGNOSIS — F419 Anxiety disorder, unspecified: Secondary | ICD-10-CM

## 2020-06-04 NOTE — Telephone Encounter (Signed)
Requested Prescriptions  Pending Prescriptions Disp Refills  . escitalopram (LEXAPRO) 10 MG tablet [Pharmacy Med Name: ESCITALOPRAM OXALATE 10 MG TAB] 35 tablet 0    Sig: TAKE 1 TABLET BY MOUTH ONCE DAILY     Psychiatry:  Antidepressants - SSRI Failed - 06/04/2020  3:18 PM      Failed - Valid encounter within last 6 months    Recent Outpatient Visits          11 months ago Encounter for annual health examination   [facility] [place], Clearnce Sorrel, [place]   3 years ago Annual physical exam   [facility] [place], Clearnce Sorrel, [place]   4 years ago Annual physical exam   [facility] [place], [place]   4 years ago Acute bacterial conjunctivitis of left eye   [facility] [place], Clearnce Sorrel, [place]   5 years ago Annual physical exam   [place], Clearnce Sorrel, [place]      Future Appointments            In 1 month Trinna Post, PA-C [employer], [facility]           Passed - Completed PHQ-2 or PHQ-9 in the last 360 days

## 2020-07-03 ENCOUNTER — Other Ambulatory Visit: Payer: Self-pay

## 2020-07-03 ENCOUNTER — Encounter: Payer: Self-pay | Admitting: Physician Assistant

## 2020-07-03 ENCOUNTER — Ambulatory Visit (INDEPENDENT_AMBULATORY_CARE_PROVIDER_SITE_OTHER): Payer: 59 | Admitting: Physician Assistant

## 2020-07-03 VITALS — BP 127/71 | HR 61 | Temp 98.8°F | Ht 59.0 in | Wt 138.9 lb

## 2020-07-03 DIAGNOSIS — F1010 Alcohol abuse, uncomplicated: Secondary | ICD-10-CM

## 2020-07-03 DIAGNOSIS — E7800 Pure hypercholesterolemia, unspecified: Secondary | ICD-10-CM | POA: Diagnosis not present

## 2020-07-03 DIAGNOSIS — Z0000 Encounter for general adult medical examination without abnormal findings: Secondary | ICD-10-CM

## 2020-07-03 DIAGNOSIS — Z23 Encounter for immunization: Secondary | ICD-10-CM

## 2020-07-03 DIAGNOSIS — F5101 Primary insomnia: Secondary | ICD-10-CM

## 2020-07-03 DIAGNOSIS — F419 Anxiety disorder, unspecified: Secondary | ICD-10-CM

## 2020-07-03 DIAGNOSIS — Z1239 Encounter for other screening for malignant neoplasm of breast: Secondary | ICD-10-CM

## 2020-07-03 DIAGNOSIS — K2100 Gastro-esophageal reflux disease with esophagitis, without bleeding: Secondary | ICD-10-CM

## 2020-07-03 DIAGNOSIS — I7300 Raynaud's syndrome without gangrene: Secondary | ICD-10-CM

## 2020-07-03 MED ORDER — ESCITALOPRAM OXALATE 10 MG PO TABS: 1.0000 | ORAL_TABLET | Freq: Every day | ORAL | 4 refills | Status: DC

## 2020-07-03 MED ORDER — TRAZODONE HCL 50 MG PO TABS: ORAL_TABLET | ORAL | 4 refills | Status: DC

## 2020-07-03 MED ORDER — ATORVASTATIN CALCIUM 10 MG PO TABS: 1.0000 | ORAL_TABLET | Freq: Every day | ORAL | 4 refills | Status: DC

## 2020-07-03 MED ORDER — NIFEDIPINE ER OSMOTIC RELEASE 30 MG PO TB24: 30.0000 mg | ORAL_TABLET | Freq: Every day | ORAL | 4 refills | Status: DC

## 2020-07-03 MED ORDER — NALTREXONE HCL 50 MG PO TABS: 50.0000 mg | ORAL_TABLET | Freq: Every day | ORAL | 1 refills | Status: DC

## 2020-07-03 MED ORDER — PANTOPRAZOLE SODIUM 40 MG PO TBEC: 1.0000 | DELAYED_RELEASE_TABLET | Freq: Every day | ORAL | 4 refills | Status: DC

## 2020-07-03 NOTE — Patient Instructions (Addendum)
[facility] [address] [place],  [place]  [address] Main: [phone]   Preventive Care 40-64 Years Old, Female Preventive care refers to lifestyle choices and visits with your health care provider that can promote health and wellness. This includes:  A yearly physical exam. This is also called an annual wellness visit.  Regular dental and eye exams.  Immunizations.  Screening for certain conditions.  Healthy lifestyle choices, such as: ? Eating a healthy diet. ? Getting regular exercise. ? Not using drugs or products that contain nicotine and tobacco. ? Limiting alcohol use. What can I expect for my preventive care visit? Physical exam Your health care provider will check your:  Height and weight. These may be used to calculate your BMI (body mass index). BMI is a measurement that tells if you are at a healthy weight.  Heart rate and blood pressure.  Body temperature.  Skin for abnormal spots. Counseling Your health care provider may ask you questions about your:  Past medical problems.  Family's medical history.  Alcohol, tobacco, and drug use.  Emotional well-being.  Home life and relationship well-being.  Sexual activity.  Diet, exercise, and sleep habits.  Work and work environment.  Access to firearms.  Method of birth control.  Menstrual cycle.  Pregnancy history. What immunizations do I need? Vaccines are usually given at various ages, according to a schedule. Your health care provider will recommend vaccines for you based on your age, medical history, and lifestyle or other factors, such as travel or where you work.   What tests do I need? Blood tests  Lipid and cholesterol levels. These may be checked every 5 years, or more often if you are over 50 years old.  Hepatitis C test.  Hepatitis B test. Screening  Lung cancer screening. You may have this screening every year starting at age 55 if you  have a 30-pack-year history of smoking and currently smoke or have quit within the past 15 years.  Colorectal cancer screening. ? All adults should have this screening starting at age 50 and continuing until age 75. ? Your health care provider may recommend screening at age 45 if you are at increased risk. ? You will have tests every 1-10 years, depending on your results and the type of screening test.  Diabetes screening. ? This is done by checking your blood sugar (glucose) after you have not eaten for a while (fasting). ? You may have this done every 1-3 years.  Mammogram. ? This may be done every 1-2 years. ? Talk with your health care provider about when you should start having regular mammograms. This may depend on whether you have a family history of breast cancer.  BRCA-related cancer screening. This may be done if you have a family history of breast, ovarian, tubal, or peritoneal cancers.  Pelvic exam and Pap test. ? This may be done every 3 years starting at age 21. ? Starting at age 30, this may be done every 5 years if you have a Pap test in combination with an HPV test. Other tests  STD (sexually transmitted disease) testing, if you are at risk.  Bone density scan. This is done to screen for osteoporosis. You may have this scan if you are at high risk for osteoporosis. Talk with your health care provider about your test results, treatment options, and if necessary, the need for more tests. Follow these instructions at home: Eating and drinking  Eat a diet   that includes fresh fruits and vegetables, whole grains, lean protein, and low-fat dairy products.  Take vitamin and mineral supplements as recommended by your health care provider.  Do not drink alcohol if: ? Your health care provider tells you not to drink. ? You are pregnant, may be pregnant, or are planning to become pregnant.  If you drink alcohol: ? Limit how much you have to 0-1 drink a day. ? Be aware of  how much alcohol is in your drink. In the [place] one drink equals one 12 oz bottle of beer (355 mL), one 5 oz glass of wine (148 mL), or one 1 oz glass of hard liquor (44 mL).   Lifestyle  Take daily care of your teeth and gums. Brush your teeth every morning and night with fluoride toothpaste. Floss one time each day.  Stay active. Exercise for at least 30 minutes 5 or more days each week.  Do not use any products that contain nicotine or tobacco, such as cigarettes, e-cigarettes, and chewing tobacco. If you need help quitting, ask your health care provider.  Do not use drugs.  If you are sexually active, practice safe sex. Use a condom or other form of protection to prevent STIs (sexually transmitted infections).  If you do not wish to become pregnant, use a form of birth control. If you plan to become pregnant, see your health care provider for a prepregnancy visit.  If told by your health care provider, take low-dose aspirin daily starting at age 38.  Find healthy ways to cope with stress, such as: ? Meditation, yoga, or listening to music. ? Journaling. ? Talking to a trusted person. ? Spending time with friends and family. Safety  Always wear your seat belt while driving or riding in a vehicle.  Do not drive: ? If you have been drinking alcohol. Do not ride with someone who has been drinking. ? When you are tired or distracted. ? While texting.  Wear a helmet and other protective equipment during sports activities.  If you have firearms in your house, make sure you follow all gun safety procedures. What's next?  Visit your health care provider once a year for an annual wellness visit.  Ask your health care provider how often you should have your eyes and teeth checked.  Stay up to date on all vaccines. This information is not intended to replace advice given to you by your health care provider. Make sure you discuss any questions you have with your health care  provider. Document Revised: 12/19/2019 Document Reviewed: 11/25/2017 Elsevier Patient Education  2021 [employer].   Rethinking drinking (NIH Publication No. 37-9024). Retrieved from https://pubs.ScienceMakers.nl.pdf">  Alcohol Use Disorder Alcohol use disorder is a condition in which drinking disrupts daily life. People with this condition drink too much alcohol and cannot control their drinking. Alcohol use disorder can cause serious problems with physical health. It can affect the brain, heart, and other internal organs. This disorder can raise the risk for certain cancers and cause problems with mental health, such as depression or anxiety. What are the causes? This condition is caused by drinking too much alcohol over time. Some people with this condition drink to cope with or escape from negative life events. Others drink to relieve pain or symptoms of mental illness. What increases the risk? You are more likely to develop this condition if:  You have a family history of alcohol use disorder.  Your culture encourages drinking to the point of becoming drunk (  intoxication).  You had a mood or conduct disorder in childhood.  You have been abused.  You are an adolescent and you: ? Have poor performance in school. ? Have poor supervision or guidance. ? Act on impulse and like taking risks. What are the signs or symptoms? Symptoms of this condition include:  Drinking more than you want to.  Trying several times without success to drink less.  Spending a lot of time thinking about alcohol, getting alcohol, drinking, or recovering from drinking.  Continuing to drink even when it is causing serious problems in your daily life.  Drinking when it is dangerous to drink, such as before driving a car.  Needing more and more alcohol to get the same effect you want (building up tolerance).  Having symptoms of withdrawal when you stop  drinking. Withdrawal symptoms may include: ? Trouble sleeping, leading to tiredness (fatigue). ? Mood swings of depression and anxiety. ? Physical symptoms, such as a fast heart rate, rapid breathing, high blood pressure (hypertension), fever, cold sweats, or nausea. ? Seizures. ? Severe confusion. ? Feeling or seeing things that are not there (hallucinations). ? Shaking movements that you cannot control (tremors). How is this diagnosed? This condition is diagnosed with an assessment. Your health care provider may start by asking three or four questions about your drinking, or he or she may give you a simple test to take. This helps to get clear information from you. You may also have a physical exam or lab tests. You may be referred to a substance abuse counselor. How is this treated? With education, some people with alcohol use disorder are able to reduce their drinking. Many with this disorder cannot change their drinking behavior on their own and need help from substance use specialists. These specialists are counselors who can help diagnose how severe your disorder is and what type of treatment you need. Treatments may include:  Detoxification. Detoxification involves quitting drinking with supervision and direction of health care providers. Your health care provider may prescribe prescription medicines within the first week to help lessen withdrawal symptoms. Alcohol withdrawal can be dangerous and life-threatening. Detoxification may be provided in a home, community, or primary care setting, or in a hospital or substance use treatment facility.  Counseling. This may involve motivational interviewing (MI), family therapy, or cognitive behavioral therapy (CBT). It is provided by substance use treatment counselors or professional therapists. A counselor can address the things you can do to change your drinking behavior and how to maintain the changes. Talk therapy aims to: ? Identify your  positive motivations to change. ? Identify and avoid the things that trigger your drinking. ? Help you learn how to plan your behavior change. ? Develop support systems that can help you sustain the change.  Medicines. Medicines can help treat this disorder by: ? Decreasing cravings. ? Decreasing the positive feeling you have when you drink. ? Causing an uncomfortable physical reaction when you drink (aversion therapy).  Mutual help groups such as Alcoholics Anonymous (AA). These groups are led by people who have quit drinking. The groups provide emotional support, advice, and guidance. Some people with this condition benefit from a combination of treatments provided by specialized substance use treatment centers.   Follow these instructions at home: Medicines  Take over-the-counter and prescription medicines only as told by your health care provider.  Ask before starting any new medicines, herbs, or supplements. General instructions  Ask friends and family members to support your choice to stay sober.  Avoid situations where alcohol is served.  Create a plan to deal with tempting situations.  Attend support groups regularly.  Practice hobbies or activities you enjoy.  Do not drink and drive.  Keep all follow-up visits as told by your health care provider. This is important.   How is this prevented?  If you drink alcohol: ? Limit how much you use to:  0-1 drink a day for nonpregnant women.  0-2 drinks a day for men. ? Be aware of how much alcohol is in your drink. In the [place] one drink equals one 12 oz bottle of beer (355 mL), one 5 oz glass of wine (148 mL), or one 1 oz glass of hard liquor (44 mL).  If you have a mental health condition, seek treatment. Develop a healthy lifestyle through: ? Meditation or deep breathing. ? Exercise. ? Spending time in nature. ? Listening to music. ? Talking with a trusted friend or family member.  If you are an adolescent: ? Do  not drink alcohol. Avoid gatherings where you might be tempted. ? Do not be afraid to say no if someone offers you alcohol. Speak up about why you do not want to drink. Set a positive example for others around you by not drinking. ? Build relationships with friends who do not drink. Where to find more information  Substance Abuse and Mental Health Services Administration: SamedayNews.com.cy  Alcoholics Anonymous: ShedSizes.ch Contact a health care provider if:  You cannot take your medicines as told.  Your symptoms get worse or you experience symptoms of withdrawal when you stop drinking.  You start drinking again (relapse) and your symptoms get worse. Get help right away if:  You have thoughts about hurting yourself or others. If you ever feel like you may hurt yourself or others, or have thoughts about taking your own life, get help right away. Go to your nearest emergency department or:  Call your local emergency services (911 in the [place]  Call a suicide crisis helpline, such as the [place] at 320-437-3045. This is open 24 hours a day in the [place]  Text the Crisis Text Line at 7541435084 (in the [place].). Summary  Alcohol use disorder is a condition in which drinking disrupts daily life. People with this condition drink too much alcohol and cannot control their drinking.  Treatment may include detoxification, counseling, medicines, and support groups.  Ask friends and family members to support you. Avoid situations where alcohol is served.  Get help right away if you have thoughts about hurting yourself or others. This information is not intended to replace advice given to you by your health care provider. Make sure you discuss any questions you have with your health care provider. Document Revised: 02/02/2019 Document Reviewed: 02/02/2019 Elsevier Patient Education  2021 [employer].   Naltrexone tablets What is this medicine? NALTREXONE (nal TREX one) helps  you to remain free of your dependence on opiate drugs or alcohol. It blocks the 'high' that these substances can give you. This medicine is combined with counseling and support groups. This medicine may be used for other purposes; ask your health care provider or pharmacist if you have questions. COMMON BRAND NAME(S): Depade, ReVia What should I tell my health care provider before I take this medicine? They need to know if you have any of these conditions:  if you have used drugs or alcohol within 7 to 10 days  kidney disease  liver disease, including hepatitis  an unusual or  allergic reaction to naltrexone, other medicines, foods, dyes, or preservatives  pregnant or trying to get pregnant  breast-feeding How should I use this medicine? Take this medicine by mouth with a full glass of water. Follow the directions on the prescription label. Do not take this medicine within 7 to 10 days of taking any opioid drugs. Take your medicine at regular intervals. Do not take your medicine more often than directed. Do not stop taking except on your doctor's advice. Talk to your pediatrician regarding the use of this medicine in children. Special care may be needed. Overdosage: If you think you have taken too much of this medicine contact a poison control center or emergency room at once. NOTE: This medicine is only for you. Do not share this medicine with others. What if I miss a dose? If you miss a dose and remember on the same day, take the missed dose. If you do not remember until the next day, ask your doctor or health care professional about rescheduling your doses. Do not take double or extra doses. What may interact with this medicine? Do not take this medicine with any of the following medications:  any prescription or street opioid drug like codiene, heroin, methadone This medicine may also interact with the following medications:  disulfiram  thioridazine This list may not describe all  possible interactions. Give your health care provider a list of all the medicines, herbs, non-prescription drugs, or dietary supplements you use. Also tell them if you smoke, drink alcohol, or use illegal drugs. Some items may interact with your medicine. What should I watch for while using this medicine? Your condition will be monitored carefully while you are receiving this medicine. Visit your doctor or health care professional regularly. For this medicine to be most effective you should attend any counseling or support groups that your doctor or health care professional recommends. Do not try to overcome the effects of the medicine by taking large amounts of narcotics or by drinking large amounts of alcohol. This can cause severe problems including death. Also, you may be more sensitive to lower doses of narcotics after you stop taking this medicine. If you are going to have surgery, tell your doctor or health care professional that you are taking this medicine. Do not treat yourself for coughs, colds, pain, or diarrhea. Ask your doctor or health care professional for advice. Some of the ingredients may interact with this medicine and cause side effects. Wear a medical ID bracelet or chain, and carry a card that describes your disease and details of your medicine and dosage times. You may get drowsy or dizzy. Do not drive, use machinery, or do anything that needs mental alertness until you know how this medicine affects you. Do not stand or sit up quickly, especially if you are an older patient. This reduces the risk of dizzy or fainting spells. Alcohol may interfere with the effect of this medicine. Avoid alcoholic drinks. What side effects may I notice from receiving this medicine? Side effects that you should report to your doctor or health care professional as soon as possible:  allergic reactions like skin rash, itching or hives, swelling of the face, lips, or tongue  breathing  problems  changes in vision, hearing  confusion  dark urine  depressed mood  diarrhea  fast or irregular heart beat  hallucination, loss of contact with reality  light-colored stools  right upper belly pain  suicidal thoughts or other mood changes  unusually weak or  tired  vomiting  yellowing of the eyes or skin Side effects that usually do not require medical attention (report to your doctor or health care professional if they continue or are bothersome):  aches, pains  change in sex drive or performance  feeling anxious  headache  loss of appetite, nausea  runny nose, sinus problems, sneezing  stomach pain  trouble sleeping This list may not describe all possible side effects. Call your doctor for medical advice about side effects. You may report side effects to FDA at 1-800-FDA-1088. Where should I keep my medicine? Keep out of the reach of children. Store at room temperature between 20 and 25 degrees C (68 and 77 degrees F). Throw away any unused medicine after the expiration date. NOTE: This sheet is a summary. It may not cover all possible information. If you have questions about this medicine, talk to your doctor, pharmacist, or health care provider.  2021 Elsevier/Gold Standard (2012-01-07 10:33:18)

## 2020-07-03 NOTE — Progress Notes (Signed)
Complete physical exam   Patient: Dawn Adkins   DOB: [birth year]   65 y.o. Female  MRN: [id number] Visit Date: 07/03/2020  Today's healthcare provider: Mar Daring, PA-C   Chief Complaint  Patient presents with  . Annual Exam  . Anxiety   Subjective    Dawn Adkins is a 65 y.o. female who presents today for a complete physical exam.  She reports consuming a general diet. The patient does not participate in regular exercise at present. She generally feels well. She reports sleeping fairly well. She does not have additional problems to discuss today.  HPI  Lipid/Cholesterol, Follow-up  Last lipid panel Other pertinent labs  Lab Results  Component Value Date   CHOL 187 07/03/2019   HDL 46 07/03/2019   LDLCALC 119 (H) 07/03/2019   TRIG 122 07/03/2019   CHOLHDL 4.1 07/03/2019   Lab Results  Component Value Date   ALT 16 07/03/2019   AST 19 07/03/2019   PLT 287 07/03/2019   TSH 2.120 07/03/2019     She was last seen for this 1 years ago.  Management since that visit includes continue current medication.  She reports good compliance with treatment. She is not having side effects.   Symptoms: No chest pain No chest pressure/discomfort  No dyspnea No lower extremity edema  No numbness or tingling of extremity No orthopnea  No palpitations No paroxysmal nocturnal dyspnea  No speech difficulty No syncope   Current diet: well balanced Current exercise: housecleaning and no regular exercise  The 10-year ASCVD risk score Mikey Bussing DC Brooke Bonito., et al., 2013) is: 4.2%  ---------------------------------------------------------------------------------------------------    Past Medical History:  Diagnosis Date  . Anxiety   . Depression   . GERD (gastroesophageal reflux disease)   . Hyperlipidemia   . Motion sickness    back seat - car   Past Surgical History:  Procedure Laterality Date  . COLONOSCOPY  2011  . COLONOSCOPY WITH PROPOFOL N/A 04/18/2015    Procedure: COLONOSCOPY WITH PROPOFOL;  Surgeon: Lucilla Lame, MD;  Location: [facility];  Service: Endoscopy;  Laterality: N/A;  . POLYPECTOMY  04/18/2015   Procedure: POLYPECTOMY;  Surgeon: Lucilla Lame, MD;  Location: [facility];  Service: Endoscopy;;  . TUBAL LIGATION  1984   Social History   Socioeconomic History  . Marital status: Married    Spouse name: Celvin  . Number of children: 3  . Years of education: Not on file  . Highest education level: Not on file  Occupational History  . Occupation: part itme  Tobacco Use  . Smoking status: Never Smoker  . Smokeless tobacco: Never Used  Vaping Use  . Vaping Use: Never used  Substance and Sexual Activity  . Alcohol use: Yes    Alcohol/week: 4.0 standard drinks    Types: 4 Cans of beer per week    Comment: OCCASIONALLY  . Drug use: No  . Sexual activity: Not on file  Other Topics Concern  . Not on file  Social History Narrative  . Not on file   Social Determinants of Health   Financial Resource Strain: Not on file  Food Insecurity: Not on file  Transportation Needs: Not on file  Physical Activity: Not on file  Stress: Not on file  Social Connections: Not on file  Intimate Partner Violence: Not on file   Family Status  Relation Name Status  . Mother  Deceased  . Son 1 Alive  . Father  Deceased  . Brother 1 Alive  . Brother 2 Alive  . Brother 3 Alive  . Son 2 Alive  . Daughter  Alive  . Neg Hx  (Not Specified)   Family History  Problem Relation Age of Onset  . Cervical cancer Mother   . Hypertension Mother   . Heart disease Mother   . Diabetes Son   . Mental illness Son   . Seizures Brother 17       fell from a 2 story building  . Seizures Brother   . Healthy Son   . Healthy Daughter   . Breast cancer Neg Hx    No Known Allergies  Patient Care Team: Rubye Beach as PCP - General (Physician Assistant)   Medications: Outpatient Medications Prior to Visit  Medication  Sig  . aspirin 81 MG tablet Take 81 mg by mouth daily.  Marland Kitchen atorvastatin (LIPITOR) 10 MG tablet TAKE 1 TABLET BY MOUTH ONCE DAILY.  . Ergocalciferol (VITAMIN D2) 2000 UNITS TABS Take 1 tablet by mouth daily.  Marland Kitchen escitalopram (LEXAPRO) 10 MG tablet TAKE 1 TABLET BY MOUTH ONCE DAILY  . NIFEdipine (PROCARDIA-XL/NIFEDICAL-XL) 30 MG 24 hr tablet TAKE 1 TABLET BY MOUTH ONCE DAILY  . pantoprazole (PROTONIX) 40 MG tablet TAKE 1 TABLET BY MOUTH ONCE DAILY  . phentermine (ADIPEX-P) 37.5 MG tablet Take 37.5 mg by mouth daily.  . traZODone (DESYREL) 50 MG tablet TAKE 1/2 TO 1 TABLET BY MOUTH ONCE DAILY   No facility-administered medications prior to visit.    Review of Systems  Constitutional: Negative.   HENT: Negative.   Eyes: Negative.   Respiratory: Negative.   Cardiovascular: Negative.   Gastrointestinal: Negative.   Endocrine: Negative.   Genitourinary: Negative.   Musculoskeletal: Negative.   Skin: Negative.   Allergic/Immunologic: Negative.   Neurological: Negative.   Hematological: Negative.   Psychiatric/Behavioral: Negative.     Last CBC Lab Results  Component Value Date   WBC 9.0 07/03/2019   HGB 13.0 07/03/2019   HCT 38.7 07/03/2019   MCV 88 07/03/2019   MCH 29.4 07/03/2019   RDW 13.1 07/03/2019   PLT 287 36/62/9476   Last metabolic panel Lab Results  Component Value Date   GLUCOSE 90 07/03/2019   NA 139 07/03/2019   K 4.3 07/03/2019   CL 103 07/03/2019   CO2 21 07/03/2019   BUN 13 07/03/2019   CREATININE 0.76 07/03/2019   GFRNONAA 85 07/03/2019   GFRAA 98 07/03/2019   CALCIUM 9.3 07/03/2019   PROT 6.6 07/03/2019   ALBUMIN 4.5 07/03/2019   LABGLOB 2.1 07/03/2019   AGRATIO 2.1 07/03/2019   BILITOT 0.3 07/03/2019   ALKPHOS 84 07/03/2019   AST 19 07/03/2019   ALT 16 07/03/2019      Objective    BP 127/71 (BP Location: Left Arm, Patient Position: Sitting, Cuff Size: Normal)   Pulse 61   Temp 98.8 F (37.1 C) (Oral)   Ht 4\' 11"  (1.499 m)   Wt 138 lb  14.4 oz (63 kg)   SpO2 100%   BMI 28.05 kg/m  BP Readings from Last 3 Encounters:  07/03/20 127/71  07/03/19 135/77  05/05/17 (!) 166/100   Wt Readings from Last 3 Encounters:  07/03/20 138 lb 14.4 oz (63 kg)  07/03/19 137 lb (62.1 kg)  05/05/17 133 lb (60.3 kg)      Physical Exam Vitals reviewed.  Constitutional:      General: She is not in acute distress.  Appearance: Normal appearance. She is well-developed, well-groomed and overweight. She is not diaphoretic.  HENT:     Head: Normocephalic and atraumatic.     Right Ear: Tympanic membrane, ear canal and external ear normal.     Left Ear: Tympanic membrane, ear canal and external ear normal.  Eyes:     General: No scleral icterus.       Right eye: No discharge.        Left eye: No discharge.     Extraocular Movements: Extraocular movements intact.     Conjunctiva/sclera: Conjunctivae normal.     Pupils: Pupils are equal, round, and reactive to light.  Neck:     Thyroid: No thyromegaly.     Vascular: No carotid bruit or JVD.     Trachea: No tracheal deviation.  Cardiovascular:     Rate and Rhythm: Normal rate and regular rhythm.     Pulses: Normal pulses.     Heart sounds: Normal heart sounds. No murmur heard. No friction rub. No gallop.   Pulmonary:     Effort: Pulmonary effort is normal. No respiratory distress.     Breath sounds: Normal breath sounds. No wheezing or rales.  Chest:     Chest wall: No tenderness.  Abdominal:     General: Abdomen is flat. Bowel sounds are normal. There is no distension.     Palpations: Abdomen is soft. There is no mass.     Tenderness: There is no abdominal tenderness. There is no guarding or rebound.  Musculoskeletal:        General: No tenderness. Normal range of motion.     Cervical back: Normal range of motion and neck supple. No tenderness.     Right lower leg: No edema.     Left lower leg: No edema.  Lymphadenopathy:     Cervical: No cervical adenopathy.  Skin:     General: Skin is warm and dry.     Capillary Refill: Capillary refill takes less than 2 seconds.     Findings: No rash.  Neurological:     General: No focal deficit present.     Mental Status: She is alert and oriented to person, place, and time. Mental status is at baseline.  Psychiatric:        Mood and Affect: Mood normal.        Behavior: Behavior normal. Behavior is cooperative.        Thought Content: Thought content normal.        Judgment: Judgment normal.     Last depression screening scores PHQ 2/9 Scores 07/03/2020 07/03/2019 05/05/2017  PHQ - 2 Score 0 0 1  PHQ- 9 Score 0 - 4   Last fall risk screening Fall Risk  07/03/2020  Falls in the past year? 0  Number falls in past yr: 0  Injury with Fall? 0  Risk for fall due to : No Fall Risks  Follow up Falls evaluation completed   Last Audit-C alcohol use screening Alcohol Use Disorder Test (AUDIT) 07/03/2020  1. How often do you have a drink containing alcohol? 3  2. How many drinks containing alcohol do you have on a typical day when you are drinking? 1  3. How often do you have six or more drinks on one occasion? 2  AUDIT-C Score 6  4. How often during the last year have you found that you were not able to stop drinking once you had started? 0  5. How often during the last  year have you failed to do what was normally expected from you because of drinking? 0  6. How often during the last year have you needed a first drink in the morning to get yourself going after a heavy drinking session? 0  7. How often during the last year have you had a feeling of guilt of remorse after drinking? 0  8. How often during the last year have you been unable to remember what happened the night before because you had been drinking? 0  9. Have you or someone else been injured as a result of your drinking? 0  10. Has a relative or friend or a doctor or another health worker been concerned about your drinking or suggested you cut down? 0  Alcohol Use  Disorder Identification Test Final Score (AUDIT) 6  Alcohol Brief Interventions/Follow-up -   A score of 3 or more in women, and 4 or more in men indicates increased risk for alcohol abuse, EXCEPT if all of the points are from question 1   No results found for any visits on 07/03/20.  Assessment & Plan    Routine Health Maintenance and Physical Exam  Exercise Activities and Dietary recommendations Goals   None     Immunization History  Administered Date(s) Administered  . Influenza Inj Mdck Quad With Preservative 03/15/2017  . Influenza Split 12/19/2010, 12/22/2011  . Influenza,inj,Quad PF,6+ Mos 01/24/2013, 01/26/2014, 03/14/2015  . Td 05/29/2004  . Tdap 11/28/2009  . Zoster Recombinat (Shingrix) 05/05/2017, 07/05/2017    Health Maintenance  Topic Date Due  . COVID-19 Vaccine (1) Never done  . TETANUS/TDAP  11/29/2019  . INFLUENZA VACCINE  10/28/2020  . MAMMOGRAM  05/28/2021  . PAP SMEAR-Modifier  07/03/2022  . COLONOSCOPY (Pts 45-25yrs Insurance coverage will need to be confirmed)  04/17/2025  . Hepatitis C Screening  Completed  . HIV Screening  Completed  . HPV VACCINES  Aged Out    Discussed health benefits of physical activity, and encouraged her to engage in regular exercise appropriate for her age and condition.  1. Annual physical exam Normal physical exam today. Will check labs as below and f/u pending lab results. If labs are stable and WNL she will not need to have these rechecked for one year at her next annual physical exam. She is to call the office in the meantime if she has any acute issue, questions or concerns. - TSH - Lipid panel - Comprehensive metabolic panel - CBC with Differential/Platelet  2. Encounter for breast cancer screening using non-mammogram modality There is no family history of breast cancer. She does perform regular self breast exams. Mammogram was ordered as below. Information for [facility] clinic was given to patient so she  may schedule her mammogram at her convenience. - MM 3D SCREEN BREAST BILATERAL; Future  3. Hypercholesterolemia without hypertriglyceridemia Stable. Diagnosis pulled for medication refill. Continue current medical treatment plan.Will check labs as below and f/u pending results. - Lipid panel - Comprehensive metabolic panel - CBC with Differential/Platelet - atorvastatin (LIPITOR) 10 MG tablet; Take 1 tablet (10 mg total) by mouth daily.  Dispense: 90 tablet; Refill: 4  4. Anxiety Stable. Diagnosis pulled for medication refill. Continue current medical treatment plan. Will check labs as below and f/u pending results. - TSH - escitalopram (LEXAPRO) 10 MG tablet; Take 1 tablet (10 mg total) by mouth daily.  Dispense: 90 tablet; Refill: 4  5. Raynaud's syndrome without gangrene Stable. Diagnosis pulled for medication refill. Continue current medical treatment  plan. Will check labs as below and f/u pending results. - Comprehensive metabolic panel - NIFEdipine (PROCARDIA-XL/NIFEDICAL-XL) 30 MG 24 hr tablet; Take 1 tablet (30 mg total) by mouth daily.  Dispense: 90 tablet; Refill: 4  6. Esophagitis, reflux Stable. Diagnosis pulled for medication refill. Continue current medical treatment plan. Will check labs as below and f/u pending results. - Comprehensive metabolic panel - pantoprazole (PROTONIX) 40 MG tablet; Take 1 tablet (40 mg total) by mouth daily.  Dispense: 90 tablet; Refill: 4  7. Primary insomnia Stable. Diagnosis pulled for medication refill. Continue current medical treatment plan. Will check labs as below and f/u pending results. - TSH - traZODone (DESYREL) 50 MG tablet; TAKE 1/2 TO 1 TABLET BY MOUTH ONCE DAILY  Dispense: 90 tablet; Refill: 4  8. Need for Tdap vaccination Tdap Vaccine given to patient without complications. Patient sat for 15 minutes after administration and was tolerated well without adverse effects. - Tdap vaccine greater than or equal to 7yo IM  9.  Alcohol abuse Patient reports increased alcohol consumption, drinking 4-6 beers daily. She does report she can go a couple of days or even a week without drinking then picks back up again. Her brother that is living with her is drinking a lot too and feels this is her trigger. Also lost her other brother 5 months ago and that is when it really started to increase. Would like to try Naltrexone to stop cravings for alcohol. Will give as below. Take daily x 1 month, then every other day for months 2 and 3, then stop. Advised of adverse effect of naltrexone with alcohol consumption.  - naltrexone (DEPADE) 50 MG tablet; Take 1 tablet (50 mg total) by mouth daily.  Dispense: 30 tablet; Refill: 1   No follow-ups on file.     Reynolds Bowl, PA-C, have reviewed all documentation for this visit. The documentation on 07/03/20 for the exam, diagnosis, procedures, and orders are all accurate and complete.   Rubye Beach  [facility] [phone] (phone) [phone] (fax)  [place]

## 2020-07-04 ENCOUNTER — Encounter: Payer: 59 | Admitting: Physician Assistant

## 2020-07-04 LAB — LIPID PANEL
Chol/HDL Ratio: 3.9 ratio (ref 0.0–4.4)
Cholesterol, Total: 217 mg/dL — ABNORMAL HIGH (ref 100–199)
HDL: 55 mg/dL (ref >39)
LDL Chol Calc (NIH): 137 mg/dL — ABNORMAL HIGH (ref 0–99)
Triglycerides: 141 mg/dL (ref 0–149)
VLDL Cholesterol Cal: 25 mg/dL (ref 5–40)

## 2020-07-04 LAB — COMPREHENSIVE METABOLIC PANEL
ALT: 21 IU/L (ref 0–32)
AST: 22 IU/L (ref 0–40)
Albumin/Globulin Ratio: 2 (ref 1.2–2.2)
Albumin: 4.9 g/dL — ABNORMAL HIGH (ref 3.8–4.8)
Alkaline Phosphatase: 89 IU/L (ref 44–121)
BUN/Creatinine Ratio: 19 (ref 12–28)
BUN: 14 mg/dL (ref 8–27)
Bilirubin Total: 0.6 mg/dL (ref 0.0–1.2)
CO2: 23 mmol/L (ref 20–29)
Calcium: 10.3 mg/dL (ref 8.7–10.3)
Chloride: 99 mmol/L (ref 96–106)
Creatinine, Ser: 0.73 mg/dL (ref 0.57–1.00)
Globulin, Total: 2.5 g/dL (ref 1.5–4.5)
Glucose: 91 mg/dL (ref 65–99)
Potassium: 4.8 mmol/L (ref 3.5–5.2)
Sodium: 138 mmol/L (ref 134–144)
Total Protein: 7.4 g/dL (ref 6.0–8.5)
eGFR: 93 mL/min/{1.73_m2} (ref >59)

## 2020-07-04 LAB — CBC WITH DIFFERENTIAL/PLATELET
Basophils Absolute: 0.1 10*3/uL (ref 0.0–0.2)
Basos: 1 %
EOS (ABSOLUTE): 0.1 10*3/uL (ref 0.0–0.4)
Eos: 2 %
Hematocrit: 40.8 % (ref 34.0–46.6)
Hemoglobin: 13.4 g/dL (ref 11.1–15.9)
Immature Grans (Abs): 0 10*3/uL (ref 0.0–0.1)
Immature Granulocytes: 0 %
Lymphocytes Absolute: 3.1 10*3/uL (ref 0.7–3.1)
Lymphs: 38 %
MCH: 29.1 pg (ref 26.6–33.0)
MCHC: 32.8 g/dL (ref 31.5–35.7)
MCV: 89 fL (ref 79–97)
Monocytes Absolute: 0.6 10*3/uL (ref 0.1–0.9)
Monocytes: 7 %
Neutrophils Absolute: 4.3 10*3/uL (ref 1.4–7.0)
Neutrophils: 52 %
Platelets: 306 10*3/uL (ref 150–450)
RBC: 4.6 x10E6/uL (ref 3.77–5.28)
RDW: 13.2 % (ref 11.7–15.4)
WBC: 8.2 10*3/uL (ref 3.4–10.8)

## 2020-07-04 LAB — TSH: TSH: 1.24 u[IU]/mL (ref 0.450–4.500)

## 2020-07-08 ENCOUNTER — Encounter: Payer: 59 | Admitting: Physician Assistant

## 2020-07-15 ENCOUNTER — Other Ambulatory Visit: Payer: Self-pay

## 2020-07-15 ENCOUNTER — Ambulatory Visit
Admission: RE | Admit: 2020-07-15 | Discharge: 2020-07-15 | Disposition: A | Discharge status: Home / Self Care | Payer: 59 | Source: Ambulatory Visit | Attending: Physician Assistant | Admitting: Physician Assistant

## 2020-07-15 ENCOUNTER — Telehealth: Payer: Self-pay

## 2020-07-15 DIAGNOSIS — Z1239 Encounter for other screening for malignant neoplasm of breast: Secondary | ICD-10-CM

## 2020-07-15 DIAGNOSIS — Z1231 Encounter for screening mammogram for malignant neoplasm of breast: Secondary | ICD-10-CM | POA: Diagnosis present

## 2020-07-15 NOTE — Telephone Encounter (Signed)
Pt advised.   Thanks,   -Shellyann Wandrey  

## 2020-07-15 NOTE — Telephone Encounter (Signed)
-----   Message from Mar Daring, [place] sent at 07/15/2020  1:41 PM EDT ----- Normal mammogram. Repeat screening in one year.

## 2020-08-03 ENCOUNTER — Other Ambulatory Visit: Payer: Self-pay | Admitting: Physician Assistant

## 2020-08-03 DIAGNOSIS — F1010 Alcohol abuse, uncomplicated: Secondary | ICD-10-CM

## 2020-08-05 NOTE — Telephone Encounter (Signed)
Requested medication (s) are due for refill today: no   Requested medication (s) are on the active medication list: yes  Last refill: 07/31/2020  Future visit scheduled: yes  Notes to clinic: this refill cannot be delegated    Requested Prescriptions  Pending Prescriptions Disp Refills   naltrexone (DEPADE) 50 MG tablet [Pharmacy Med Name: NALTREXONE HCL 50 MG TAB] 30 tablet 1    Sig: TAKE 1 TABLET BY MOUTH ONCE DAILY      Not Delegated - Psychiatry: Drug Dependence Therapy - naltrexone Failed - 08/03/2020 12:51 PM      Failed - This refill cannot be delegated      Failed - Valid encounter within last 12 months    Recent Outpatient Visits           1 month ago Annual physical exam   [facility] [place], Clearnce Sorrel, PA-C   1 year ago Encounter for annual health examination   [facility] [place], Clearnce Sorrel, [place]   3 years ago Annual physical exam   [facility] [place], Clearnce Sorrel, [place]   4 years ago Annual physical exam   [place], [place], [place]   4 years ago Acute bacterial conjunctivitis of left eye   [facility] [place], [place], [place]       Future Appointments             In 72 months Bacigalupo, Dionne Bucy, MD [facility] [place], [facility]             Passed - ALT in normal range and within 360 days    ALT  Date Value Ref Range Status  07/03/2020 21 0 - 32 IU/L Final          Passed - AST in normal range and within 360 days    AST  Date Value Ref Range Status  07/03/2020 22 0 - 40 IU/L Final

## 2020-08-27 ENCOUNTER — Ambulatory Visit (INDEPENDENT_AMBULATORY_CARE_PROVIDER_SITE_OTHER): Payer: 59 | Admitting: Dermatology

## 2020-08-27 ENCOUNTER — Other Ambulatory Visit: Payer: Self-pay

## 2020-08-27 DIAGNOSIS — L814 Other melanin hyperpigmentation: Secondary | ICD-10-CM

## 2020-08-27 DIAGNOSIS — W57XXXA Bitten or stung by nonvenomous insect and other nonvenomous arthropods, initial encounter: Secondary | ICD-10-CM

## 2020-08-27 DIAGNOSIS — L720 Epidermal cyst: Secondary | ICD-10-CM

## 2020-08-27 DIAGNOSIS — S70362A Insect bite (nonvenomous), left thigh, initial encounter: Secondary | ICD-10-CM

## 2020-08-27 DIAGNOSIS — I781 Nevus, non-neoplastic: Secondary | ICD-10-CM | POA: Diagnosis not present

## 2020-08-27 DIAGNOSIS — D2272 Melanocytic nevi of left lower limb, including hip: Secondary | ICD-10-CM | POA: Diagnosis not present

## 2020-08-27 DIAGNOSIS — L578 Other skin changes due to chronic exposure to nonionizing radiation: Secondary | ICD-10-CM

## 2020-08-27 DIAGNOSIS — D692 Other nonthrombocytopenic purpura: Secondary | ICD-10-CM

## 2020-08-27 DIAGNOSIS — L821 Other seborrheic keratosis: Secondary | ICD-10-CM

## 2020-08-27 DIAGNOSIS — D229 Melanocytic nevi, unspecified: Secondary | ICD-10-CM

## 2020-08-27 DIAGNOSIS — D2262 Melanocytic nevi of left upper limb, including shoulder: Secondary | ICD-10-CM

## 2020-08-27 NOTE — Patient Instructions (Signed)

## 2020-08-27 NOTE — Progress Notes (Signed)
Follow-Up Visit   Subjective  Dawn Adkins is a 65 y.o. female who presents for the following: Total body skin exam (No hx of skin ca) and check bump (L lower lip, >3 months, irritating).   The following portions of the chart were reviewed this encounter and updated as appropriate:       Review of Systems:  No other skin or systemic complaints except as noted in HPI or Assessment and Plan.  Objective  Well appearing patient in no apparent distress; mood and affect are within normal limits.  A full examination was performed including scalp, head, eyes, ears, nose, lips, neck, chest, axillae, abdomen, back, buttocks, bilateral upper extremities, bilateral lower extremities, hands, feet, fingers, toes, fingernails, and toenails. All findings within normal limits unless otherwise noted below.  Left post med thigh; L post shoulder  Objective  L post shoulder: 9.62mm flesh pap  Left post med thigh: 2.82mm med brown macule  Objective  bil legs: Telangiectasias legs  Objective  Left medial thigh: Pink edematous papule  Objective  L lower lip at vermillion: Firm white papule with erythema   Assessment & Plan    Lentigines - Scattered tan macules - Due to sun exposure - Benign-appering, observe - Recommend daily broad spectrum sunscreen SPF 30+ to sun-exposed areas, reapply every 2 hours as needed. - Call for any changes  Seborrheic Keratoses - Stuck-on, waxy, tan-brown papules and/or plaques  - Benign-appearing - Discussed benign etiology and prognosis. - Observe - Call for any changes  Melanocytic Nevi - Tan-brown and/or pink-flesh-colored symmetric macules and papules - Benign appearing on exam today - Observation - Call clinic for new or changing moles - Recommend daily use of broad spectrum spf 30+ sunscreen to sun-exposed areas.   Actinic Damage - Chronic condition, secondary to cumulative UV/sun exposure - diffuse scaly erythematous macules with  underlying dyspigmentation - Recommend daily broad spectrum sunscreen SPF 30+ to sun-exposed areas, reapply every 2 hours as needed.  - Staying in the shade or wearing long sleeves, sun glasses (UVA+UVB protection) and wide brim hats (4-inch brim around the entire circumference of the hat) are also recommended for sun protection.  - Call for new or changing lesions.  Skin cancer screening performed today.  Purpura - Chronic; persistent and recurrent.  Treatable, but not curable. - Violaceous macules and patches - Benign - Related to trauma, age, sun damage and/or use of blood thinners, chronic use of topical and/or oral steroids - Observe - Can use OTC arnica containing moisturizer such as Dermend Bruise Formula if desired - Call for worsening or other concerns - R 2nd toenail  Nevus (2) Left post med thigh; L post shoulder  Benign-appearing.  Observation.  Call clinic for new or changing moles.  Recommend daily use of broad spectrum spf 30+ sunscreen to sun-exposed areas.    Spider veins bil legs  Benign, observe.    Bug bite without infection, initial encounter Left medial thigh  Benign, observe  Milia L lower lip at vermillion  symptomatic  Acne/Milia surgery - L lower lip at vermillion Procedure risks and benefits were discussed with the patient and verbal consent was obtained. Following prep of the skin on the L lower lip at vermillion with an alcohol swab and local anesthetic injection, extraction of milium was performed with an 11 blade and comedone extractor. Vaseline ointment was applied to each site. The patient tolerated the procedure well.  Return in about 1 year (around 08/27/2021) for TBSE.  I, Othelia Pulling, RMA, am acting as scribe for Brendolyn Patty, MD .  Documentation: I have reviewed the above documentation for accuracy and completeness, and I agree with the above.  Brendolyn Patty MD

## 2020-10-10 ENCOUNTER — Other Ambulatory Visit: Payer: Self-pay | Admitting: Family Medicine

## 2020-10-10 DIAGNOSIS — F1010 Alcohol abuse, uncomplicated: Secondary | ICD-10-CM

## 2020-10-10 NOTE — Telephone Encounter (Signed)
Requested medication (s) are due for refill today - yes  Requested medication (s) are on the active medication list -yes  Future visit scheduled -yes  Last refill: 08/05/20 #30 1 RF  Notes to clinic: Request RF- non delegated Rx  Requested Prescriptions  Pending Prescriptions Disp Refills   naltrexone (DEPADE) 50 MG tablet [Pharmacy Med Name: NALTREXONE HCL 50 MG TAB] 30 tablet 1    Sig: TAKE 1 TABLET BY MOUTH ONCE DAILY      Not Delegated - Psychiatry: Drug Dependence Therapy - naltrexone Failed - 10/10/2020  4:22 PM      Failed - This refill cannot be delegated      Failed - Valid encounter within last 12 months    Recent Outpatient Visits           3 months ago Annual physical exam   [place], Clearnce Sorrel, PA-C   1 year ago Encounter for annual health examination   [employer], Clearnce Sorrel, [place]   3 years ago Annual physical exam   [place], Clearnce Sorrel, [place]   4 years ago Annual physical exam   [facility] [place], [place]   4 years ago Acute bacterial conjunctivitis of left eye   [facility] [place], [place], [place]       Future Appointments             In 9 months Bacigalupo, Dionne Bucy, [place], [facility]             Passed - ALT in normal range and within 360 days    ALT  Date Value Ref Range Status  07/03/2020 21 0 - 32 IU/L Final          Passed - AST in normal range and within 360 days    AST  Date Value Ref Range Status  07/03/2020 22 0 - 40 IU/L Final              Requested Prescriptions  Pending Prescriptions Disp Refills   naltrexone (DEPADE) 50 MG tablet [Pharmacy Med Name: NALTREXONE HCL 50 MG TAB] 30 tablet 1    Sig: TAKE 1 TABLET BY MOUTH ONCE DAILY      Not Delegated - Psychiatry: Drug Dependence Therapy - naltrexone Failed - 10/10/2020  4:22 PM      Failed - This refill cannot be delegated       Failed - Valid encounter within last 12 months    Recent Outpatient Visits           3 months ago Annual physical exam   [facility] Mar Daring, [place]   1 year ago Encounter for annual health examination   [facility] [place], Clearnce Sorrel, [place]   3 years ago Annual physical exam   [place], Clearnce Sorrel, [place]   4 years ago Annual physical exam   [place], [place], [place]   4 years ago Acute bacterial conjunctivitis of left eye   [place], Clearnce Sorrel, [place]       Future Appointments             In 9 months Bacigalupo, Dionne Bucy, MD [facility], [facility]             Passed - ALT in normal range and within 360 days    ALT  Date Value Ref Range Status  07/03/2020  21 0 - 32 IU/L Final          Passed - AST in normal range and within 360 days    AST  Date Value Ref Range Status  07/03/2020 22 0 - 40 IU/L Final

## 2021-02-10 ENCOUNTER — Other Ambulatory Visit: Payer: Self-pay | Admitting: Family Medicine

## 2021-02-10 DIAGNOSIS — F1010 Alcohol abuse, uncomplicated: Secondary | ICD-10-CM

## 2021-02-10 NOTE — Telephone Encounter (Signed)
LOV: 07/03/2020  NOV:  07/08/2021

## 2021-02-10 NOTE — Telephone Encounter (Signed)
Requested medication (s) are due for refill today: yes  Requested medication (s) are on the active medication list: yes  Last refill:  10/10/20 #30/1 RF  Future visit scheduled: Yes  Notes to clinic:  Unable to refill per protocol, cannot delegate.      Requested Prescriptions  Pending Prescriptions Disp Refills   naltrexone (DEPADE) 50 MG tablet [Pharmacy Med Name: NALTREXONE HCL 50 MG TAB] 30 tablet 1    Sig: TAKE 1 TABLET BY MOUTH ONCE DAILY     Not Delegated - Psychiatry: Drug Dependence Therapy - naltrexone Failed - 02/10/2021 10:46 AM      Failed - This refill cannot be delegated      Failed - Valid encounter within last 12 months    Recent Outpatient Visits           7 months ago Annual physical exam   [facility], Clearnce Sorrel, PA-C   1 year ago Encounter for annual health examination   [facility] [place], Clearnce Sorrel, [place]   3 years ago Annual physical exam   [facility] [place], Clearnce Sorrel, [place]   4 years ago Annual physical exam   [facility] [place], [place]   5 years ago Acute bacterial conjunctivitis of left eye   [place], [place]       Future Appointments             In 4 months Bacigalupo, Dionne Bucy, MD [facility]            Passed - ALT in normal range and within 360 days    ALT  Date Value Ref Range Status  07/03/2020 21 0 - 32 IU/L Final          Passed - AST in normal range and within 360 days    AST  Date Value Ref Range Status  07/03/2020 22 0 - 40 IU/L Final

## 2021-07-07 NOTE — Progress Notes (Signed)
? ?I,Elena D DeSanto,acting as a scribe for Lavon Paganini, MD.,have documented all relevant documentation on the behalf of Lavon Paganini, MD,as directed by  Lavon Paganini, MD while in the presence of Lavon Paganini, MD. ? ? ? ?Complete physical exam ? ? ?Patient: Dawn Adkins   DOB: Feb 02, 1956   66 y.o. Female  MRN: [id number] ?Visit Date: 07/08/2021 ? ?Today's healthcare provider: Lavon Paganini, MD  ? ?Chief Complaint  ?Patient presents with  ? Annual Exam  ? ?Subjective  ?  ?Dawn Adkins is a 66 y.o. female who presents today for a complete physical exam.  ?She reports consuming a general diet.     She generally feels well. She reports sleeping well. She does not have additional problems to discuss today.  ?HPI  ? ? ?Past Medical History:  ?Diagnosis Date  ? Anxiety   ? Depression   ? GERD (gastroesophageal reflux disease)   ? Hyperlipidemia   ? Motion sickness   ? back seat - car  ? ?Past Surgical History:  ?Procedure Laterality Date  ? COLONOSCOPY  2011  ? COLONOSCOPY WITH PROPOFOL N/A 04/18/2015  ? Procedure: COLONOSCOPY WITH PROPOFOL;  Surgeon: Lucilla Lame, MD;  Location: [place];  Service: Endoscopy;  Laterality: N/A;  ? POLYPECTOMY  04/18/2015  ? Procedure: POLYPECTOMY;  Surgeon: Lucilla Lame, MD;  Location: [place];  Service: Endoscopy;;  ? [place]  ? ?Social History  ? ?Socioeconomic History  ? Marital status: Married  ?  Spouse name: Celvin  ? Number of children: 3  ? Years of education: Not on file  ? Highest education level: Not on file  ?Occupational History  ? Occupation: part itme  ?Tobacco Use  ? Smoking status: Never  ? Smokeless tobacco: Never  ?Vaping Use  ? Vaping Use: Never used  ?Substance and Sexual Activity  ? Alcohol use: Yes  ?  Alcohol/week: 4.0 standard drinks  ?  Types: 4 Cans of beer per week  ?  Comment: OCCASIONALLY  ? Drug use: No  ? Sexual activity: Not on file  ?Other Topics Concern  ? Not on file  ?Social History  Narrative  ? Not on file  ? ?Social Determinants of Health  ? ?Financial Resource Strain: Not on file  ?Food Insecurity: Not on file  ?Transportation Needs: Not on file  ?Physical Activity: Not on file  ?Stress: Not on file  ?Social Connections: Not on file  ?Intimate Partner Violence: Not on file  ? ?Family Status  ?Relation Name Status  ? Mother  Deceased  ? Son 1 Alive  ? Father  Deceased  ? Brother 1 Alive  ? Brother 2 Alive  ? Brother 3 Alive  ? Son 2 Alive  ? Daughter  Alive  ? Neg Hx  (Not Specified)  ? ?Family History  ?Problem Relation Age of Onset  ? Cervical cancer Mother   ? Hypertension Mother   ? Heart disease Mother   ? Diabetes Son   ? Mental illness Son   ? Seizures Brother 27  ?     fell from a 2 story building  ? Seizures Brother   ? Healthy Son   ? Healthy Daughter   ? Breast cancer Neg Hx   ? ?No Known Allergies  ?Patient Care Team: ?Virginia Crews, MD as PCP - General (Family Medicine)  ? ?Medications: ?Outpatient Medications Prior to Visit  ?Medication Sig  ? aspirin 81 MG tablet Take 81 mg by  mouth daily.  ? atorvastatin (LIPITOR) 10 MG tablet Take 1 tablet (10 mg total) by mouth daily.  ? Ergocalciferol (VITAMIN D2) 2000 UNITS TABS Take 1 tablet by mouth daily.  ? escitalopram (LEXAPRO) 10 MG tablet Take 1 tablet (10 mg total) by mouth daily.  ? NIFEdipine (PROCARDIA-XL/NIFEDICAL-XL) 30 MG 24 hr tablet Take 1 tablet (30 mg total) by mouth daily.  ? pantoprazole (PROTONIX) 40 MG tablet Take 1 tablet (40 mg total) by mouth daily.  ? traZODone (DESYREL) 50 MG tablet TAKE 1/2 TO 1 TABLET BY MOUTH ONCE DAILY  ? [DISCONTINUED] naltrexone (DEPADE) 50 MG tablet TAKE 1 TABLET BY MOUTH ONCE DAILY  ? [DISCONTINUED] phentermine (ADIPEX-P) 37.5 MG tablet Take 37.5 mg by mouth daily.  ? ?No facility-administered medications prior to visit.  ? ? ?Review of Systems  ?Constitutional:  Positive for chills.  ?HENT: Negative.    ?Eyes:  Positive for itching.  ?Respiratory:  Positive for chest tightness.    ?Cardiovascular:  Positive for chest pain.  ?Gastrointestinal:  Positive for abdominal distention.  ?Endocrine: Negative.   ?Genitourinary:  Positive for vaginal pain.  ?Musculoskeletal:  Positive for arthralgias, back pain and neck pain.  ?Skin: Negative.   ?Allergic/Immunologic: Negative.   ?Neurological:  Positive for light-headedness.  ?Hematological: Negative.   ?Psychiatric/Behavioral: Negative.    ? ? ? Objective  ?  ?BP 130/70 (BP Location: Left Arm, Patient Position: Sitting, Cuff Size: Normal)   Pulse 68   Temp 98.7 ?F (37.1 ?C) (Oral)   Wt 132 lb (59.9 kg)   SpO2 95%   BMI 26.66 kg/m?  ? ? ? ?Physical Exam ?Vitals reviewed.  ?Constitutional:   ?   General: She is not in acute distress. ?   Appearance: Normal appearance. She is well-developed. She is not diaphoretic.  ?HENT:  ?   Head: Normocephalic and atraumatic.  ?   Right Ear: Tympanic membrane, ear canal and external ear normal.  ?   Left Ear: Tympanic membrane, ear canal and external ear normal.  ?   Nose: Nose normal.  ?   Mouth/Throat:  ?   Mouth: Mucous membranes are moist.  ?   Pharynx: Oropharynx is clear. No oropharyngeal exudate.  ?Eyes:  ?   General: No scleral icterus. ?   Conjunctiva/sclera: Conjunctivae normal.  ?   Pupils: Pupils are equal, round, and reactive to light.  ?Neck:  ?   Thyroid: No thyromegaly.  ?Cardiovascular:  ?   Rate and Rhythm: Normal rate and regular rhythm.  ?   Pulses: Normal pulses.  ?   Heart sounds: Normal heart sounds. No murmur heard. ?Pulmonary:  ?   Effort: Pulmonary effort is normal. No respiratory distress.  ?   Breath sounds: Normal breath sounds. No wheezing or rales.  ?Abdominal:  ?   General: There is no distension.  ?   Palpations: Abdomen is soft.  ?   Tenderness: There is no abdominal tenderness.  ?Musculoskeletal:     ?   General: No deformity.  ?   Cervical back: Neck supple.  ?   Right lower leg: No edema.  ?   Left lower leg: No edema.  ?Lymphadenopathy:  ?   Cervical: No cervical  adenopathy.  ?Skin: ?   General: Skin is warm and dry.  ?   Findings: No rash.  ?Neurological:  ?   Mental Status: She is alert and oriented to person, place, and time. Mental status is at baseline.  ?   Gait:  Gait normal.  ?Psychiatric:     ?   Mood and Affect: Mood normal.     ?   Behavior: Behavior normal.     ?   Thought Content: Thought content normal.  ?  ? ? ?Last depression screening scores ? ?  07/08/2021  ?  9:06 AM 07/03/2020  ?  1:19 PM 07/03/2019  ?  9:19 AM  ?PHQ 2/9 Scores  ?PHQ - 2 Score 0 0 0  ?PHQ- 9 Score 2 0   ? ?Last fall risk screening ? ?  07/08/2021  ?  9:05 AM  ?Fall Risk   ?Falls in the past year? 0  ? ?Last Audit-C alcohol use screening ? ?  07/08/2021  ?  9:05 AM  ?Alcohol Use Disorder Test (AUDIT)  ?1. How often do you have a drink containing alcohol? 4  ?2. How many drinks containing alcohol do you have on a typical day when you are drinking? 2  ?3. How often do you have six or more drinks on one occasion? 3  ?AUDIT-C Score 9  ?4. How often during the last year have you found that you were not able to stop drinking once you had started? 0  ?5. How often during the last year have you failed to do what was normally expected from you because of drinking? 0  ?6. How often during the last year have you needed a first drink in the morning to get yourself going after a heavy drinking session? 0  ?7. How often during the last year have you had a feeling of guilt of remorse after drinking? 1  ?8. How often during the last year have you been unable to remember what happened the night before because you had been drinking? 0  ?9. Have you or someone else been injured as a result of your drinking? 0  ?10. Has a relative or friend or a doctor or another health worker been concerned about your drinking or suggested you cut down? 0  ?Alcohol Use Disorder Identification Test Final Score (AUDIT) 10  ? ?A score of 3 or more in women, and 4 or more in men indicates increased risk for alcohol abuse, EXCEPT if all  of the points are from question 1  ? ?No results found for any visits on 07/08/21. ? Assessment & Plan  ?  ?Routine Health Maintenance and Physical Exam ? ?Exercise Activities and Dietary recommendations ? Goals   ?None

## 2021-07-08 ENCOUNTER — Encounter: Payer: Self-pay | Admitting: Family Medicine

## 2021-07-08 ENCOUNTER — Ambulatory Visit (INDEPENDENT_AMBULATORY_CARE_PROVIDER_SITE_OTHER): Payer: Managed Care, Other (non HMO) | Admitting: Family Medicine

## 2021-07-08 VITALS — BP 130/70 | HR 68 | Temp 98.7°F | Wt 132.0 lb

## 2021-07-08 DIAGNOSIS — E7800 Pure hypercholesterolemia, unspecified: Secondary | ICD-10-CM | POA: Diagnosis not present

## 2021-07-08 DIAGNOSIS — Z0000 Encounter for general adult medical examination without abnormal findings: Secondary | ICD-10-CM | POA: Diagnosis not present

## 2021-07-08 DIAGNOSIS — D692 Other nonthrombocytopenic purpura: Secondary | ICD-10-CM | POA: Insufficient documentation

## 2021-07-08 DIAGNOSIS — G4700 Insomnia, unspecified: Secondary | ICD-10-CM

## 2021-07-08 DIAGNOSIS — Z1239 Encounter for other screening for malignant neoplasm of breast: Secondary | ICD-10-CM

## 2021-07-08 DIAGNOSIS — F419 Anxiety disorder, unspecified: Secondary | ICD-10-CM

## 2021-07-08 DIAGNOSIS — F1010 Alcohol abuse, uncomplicated: Secondary | ICD-10-CM

## 2021-07-08 DIAGNOSIS — E559 Vitamin D deficiency, unspecified: Secondary | ICD-10-CM

## 2021-07-08 MED ORDER — NALTREXONE HCL 50 MG PO TABS: 100.0000 mg | ORAL_TABLET | Freq: Every day | ORAL | 1 refills | Status: DC

## 2021-07-08 NOTE — Assessment & Plan Note (Signed)
Chronic and stable, but not well controlled ?She is trying to cut back and quit ?She is not having the effects that she hoped for from the naltrexone ?Increase naltrexone dose to 100 mg daily ?

## 2021-07-08 NOTE — Assessment & Plan Note (Signed)
Chronic and well-controlled ?Continue Lexapro at current dose ?Consider therapy ?

## 2021-07-08 NOTE — Assessment & Plan Note (Signed)
Well controlled ?Taking trazodone prn ?

## 2021-07-08 NOTE — Assessment & Plan Note (Signed)
Chronic and stable senile purpura ?Continue to monitor ?

## 2021-07-08 NOTE — Assessment & Plan Note (Signed)
Continue supplement Recheck level 

## 2021-07-08 NOTE — Assessment & Plan Note (Signed)
Reviewed last lipid panel Not currently on a statin Recheck FLP and CMP Discussed diet and exercise  

## 2021-07-09 LAB — LIPID PANEL WITH LDL/HDL RATIO
Cholesterol, Total: 212 mg/dL — ABNORMAL HIGH (ref 100–199)
HDL: 59 mg/dL (ref >39)
LDL Chol Calc (NIH): 120 mg/dL — ABNORMAL HIGH (ref 0–99)
LDL/HDL Ratio: 2 ratio (ref 0.0–3.2)
Triglycerides: 190 mg/dL — ABNORMAL HIGH (ref 0–149)
VLDL Cholesterol Cal: 33 mg/dL (ref 5–40)

## 2021-07-09 LAB — COMPREHENSIVE METABOLIC PANEL
ALT: 18 IU/L (ref 0–32)
AST: 19 IU/L (ref 0–40)
Albumin/Globulin Ratio: 2.7 — ABNORMAL HIGH (ref 1.2–2.2)
Albumin: 5.3 g/dL — ABNORMAL HIGH (ref 3.8–4.8)
Alkaline Phosphatase: 100 IU/L (ref 44–121)
BUN/Creatinine Ratio: 17 (ref 12–28)
BUN: 14 mg/dL (ref 8–27)
Bilirubin Total: 0.5 mg/dL (ref 0.0–1.2)
CO2: 25 mmol/L (ref 20–29)
Calcium: 10.3 mg/dL (ref 8.7–10.3)
Chloride: 101 mmol/L (ref 96–106)
Creatinine, Ser: 0.83 mg/dL (ref 0.57–1.00)
Globulin, Total: 2 g/dL (ref 1.5–4.5)
Glucose: 110 mg/dL — ABNORMAL HIGH (ref 70–99)
Potassium: 5 mmol/L (ref 3.5–5.2)
Sodium: 143 mmol/L (ref 134–144)
Total Protein: 7.3 g/dL (ref 6.0–8.5)
eGFR: 79 mL/min/{1.73_m2} (ref >59)

## 2021-07-09 LAB — VITAMIN D 25 HYDROXY (VIT D DEFICIENCY, FRACTURES): Vit D, 25-Hydroxy: 52 ng/mL (ref 30.0–100.0)

## 2021-07-12 ENCOUNTER — Other Ambulatory Visit: Payer: Self-pay | Admitting: Physician Assistant

## 2021-07-12 DIAGNOSIS — I7300 Raynaud's syndrome without gangrene: Secondary | ICD-10-CM

## 2021-07-12 DIAGNOSIS — K2100 Gastro-esophageal reflux disease with esophagitis, without bleeding: Secondary | ICD-10-CM

## 2021-07-12 DIAGNOSIS — E7800 Pure hypercholesterolemia, unspecified: Secondary | ICD-10-CM

## 2021-07-12 DIAGNOSIS — F419 Anxiety disorder, unspecified: Secondary | ICD-10-CM

## 2021-08-11 ENCOUNTER — Ambulatory Visit
Admission: RE | Admit: 2021-08-11 | Discharge: 2021-08-11 | Disposition: A | Discharge status: Home / Self Care | Payer: Commercial Managed Care - HMO | Source: Ambulatory Visit | Attending: Family Medicine | Admitting: Family Medicine

## 2021-08-11 DIAGNOSIS — Z1231 Encounter for screening mammogram for malignant neoplasm of breast: Secondary | ICD-10-CM | POA: Insufficient documentation

## 2021-08-11 DIAGNOSIS — Z0000 Encounter for general adult medical examination without abnormal findings: Secondary | ICD-10-CM | POA: Insufficient documentation

## 2021-09-01 ENCOUNTER — Ambulatory Visit: Payer: Commercial Managed Care - HMO | Admitting: Dermatology

## 2021-09-01 DIAGNOSIS — L578 Other skin changes due to chronic exposure to nonionizing radiation: Secondary | ICD-10-CM

## 2021-09-01 DIAGNOSIS — D1800 Hemangioma unspecified site: Secondary | ICD-10-CM

## 2021-09-01 DIAGNOSIS — D2262 Melanocytic nevi of left upper limb, including shoulder: Secondary | ICD-10-CM | POA: Diagnosis not present

## 2021-09-01 DIAGNOSIS — L570 Actinic keratosis: Secondary | ICD-10-CM

## 2021-09-01 DIAGNOSIS — D229 Melanocytic nevi, unspecified: Secondary | ICD-10-CM | POA: Diagnosis not present

## 2021-09-01 DIAGNOSIS — D2272 Melanocytic nevi of left lower limb, including hip: Secondary | ICD-10-CM | POA: Diagnosis not present

## 2021-09-01 DIAGNOSIS — L814 Other melanin hyperpigmentation: Secondary | ICD-10-CM

## 2021-09-01 DIAGNOSIS — L821 Other seborrheic keratosis: Secondary | ICD-10-CM

## 2021-09-01 DIAGNOSIS — Z1283 Encounter for screening for malignant neoplasm of skin: Secondary | ICD-10-CM

## 2021-09-01 DIAGNOSIS — I8393 Asymptomatic varicose veins of bilateral lower extremities: Secondary | ICD-10-CM

## 2021-09-01 DIAGNOSIS — D692 Other nonthrombocytopenic purpura: Secondary | ICD-10-CM

## 2021-09-01 NOTE — Progress Notes (Signed)
Follow-Up Visit   Subjective  Dawn Adkins is a 66 y.o. female who presents for the following: Annual Exam (1 year tbse, spot at nose that stays red she would like checked. Hx of isks , ).  The patient presents for Total-Body Skin Exam (TBSE) for skin cancer screening and mole check.  The patient has spots, moles and lesions to be evaluated, some may be new or changing and the patient has concerns that these could be cancer.   The following portions of the chart were reviewed this encounter and updated as appropriate:      Review of Systems: No other skin or systemic complaints except as noted in HPI or Assessment and Plan.   Objective  Well appearing patient in no apparent distress; mood and affect are within normal limits.  A full examination was performed including scalp, head, eyes, ears, nose, lips, neck, chest, axillae, abdomen, back, buttocks, bilateral upper extremities, bilateral lower extremities, hands, feet, fingers, toes, fingernails, and toenails. All findings within normal limits unless otherwise noted below.  left nasal tip x 1 Pink scaly macule  left posterior medial thigh 3 x 2 mm two toned brown macule   left posterior shoulder 9 mm flesh papule   Assessment & Plan  Actinic keratosis left nasal tip x 1  Actinic keratoses are precancerous spots that appear secondary to cumulative UV radiation exposure/sun exposure over time. They are chronic with expected duration over 1 year. A portion of actinic keratoses will progress to squamous cell carcinoma of the skin. It is not possible to reliably predict which spots will progress to skin cancer and so treatment is recommended to prevent development of skin cancer.  Recommend daily broad spectrum sunscreen SPF 30+ to sun-exposed areas, reapply every 2 hours as needed.  Recommend staying in the shade or wearing long sleeves, sun glasses (UVA+UVB protection) and wide brim hats (4-inch brim around the entire  circumference of the hat). Call for new or changing lesions.  Destruction of lesion - left nasal tip x 1  Destruction method: cryotherapy   Informed consent: discussed and consent obtained   Lesion destroyed using liquid nitrogen: Yes   Region frozen until ice ball extended beyond lesion: Yes   Outcome: patient tolerated procedure well with no complications   Post-procedure details: wound care instructions given   Additional details:  Prior to procedure, discussed risks of blister formation, small wound, skin dyspigmentation, or rare scar following cryotherapy. Recommend Vaseline ointment to treated areas while healing.   Nevus (2) left posterior shoulder; left posterior medial thigh  Benign-appearing.  Observation.  Call clinic for new or changing lesions.  Recommend daily use of broad spectrum spf 30+ sunscreen to sun-exposed areas.    Lentigines - Scattered tan macules - Due to sun exposure - Benign-appearing, observe - Recommend daily broad spectrum sunscreen SPF 30+ to sun-exposed areas, reapply every 2 hours as needed. - Call for any changes  Seborrheic Keratoses - Stuck-on, waxy, tan-brown papules and/or plaques  - Benign-appearing - Discussed benign etiology and prognosis. - Observe - Call for any changes  Melanocytic Nevi - Tan-brown and/or pink-flesh-colored symmetric macules and papules - Benign appearing on exam today - Observation - Call clinic for new or changing moles - Recommend daily use of broad spectrum spf 30+ sunscreen to sun-exposed areas.   Hemangiomas - Red papules - Discussed benign nature - Observe - Call for any changes  Varicose Veins/Spider Veins - Dilated blue, purple or red veins at the  lower extremities - Reassured - Smaller vessels can be treated by sclerotherapy (a procedure to inject a medicine into the veins to make them disappear) if desired, but the treatment is not covered by insurance. Larger vessels may be covered if  symptomatic and we would refer to vascular surgeon if treatment desired.  Purpura - Chronic; persistent and recurrent.  Treatable, but not curable. - Violaceous macules and patches - Benign - Related to trauma, age, sun damage and/or use of blood thinners, chronic use of topical and/or oral steroids - Observe - Can use OTC arnica containing moisturizer such as Dermend Bruise Formula if desired - Call for worsening or other concerns  Actinic Damage - Chronic condition, secondary to cumulative UV/sun exposure - diffuse scaly erythematous macules with underlying dyspigmentation - Recommend daily broad spectrum sunscreen SPF 30+ to sun-exposed areas, reapply every 2 hours as needed.  - Staying in the shade or wearing long sleeves, sun glasses (UVA+UVB protection) and wide brim hats (4-inch brim around the entire circumference of the hat) are also recommended for sun protection.  - Call for new or changing lesions.  Skin cancer screening performed today. Return in about 1 year (around 09/02/2022) for TBSE. I, Ruthell Rummage, CMA, am acting as scribe for Brendolyn Patty, MD.  Documentation: I have reviewed the above documentation for accuracy and completeness, and I agree with the above.  Brendolyn Patty MD

## 2021-09-01 NOTE — Patient Instructions (Addendum)
Actinic keratoses are precancerous spots that appear secondary to cumulative UV radiation exposure/sun exposure over time. They are chronic with expected duration over 1 year. A portion of actinic keratoses will progress to squamous cell carcinoma of the skin. It is not possible to reliably predict which spots will progress to skin cancer and so treatment is recommended to prevent development of skin cancer.  Recommend daily broad spectrum sunscreen SPF 30+ to sun-exposed areas, reapply every 2 hours as needed.  Recommend staying in the shade or wearing long sleeves, sun glasses (UVA+UVB protection) and wide brim hats (4-inch brim around the entire circumference of the hat). Call for new or changing lesions.   Cryotherapy Aftercare  Wash gently with soap and water everyday.   Apply Vaseline and Band-Aid daily until healed.    Seborrheic Keratosis  What causes seborrheic keratoses? Seborrheic keratoses are harmless, common skin growths that first appear during adult life.  As time goes by, more growths appear.  Some people may develop a large number of them.  Seborrheic keratoses appear on both covered and uncovered body parts.  They are not caused by sunlight.  The tendency to develop seborrheic keratoses can be inherited.  They vary in color from skin-colored to gray, brown, or even black.  They can be either smooth or have a rough, warty surface.   Seborrheic keratoses are superficial and look as if they were stuck on the skin.  Under the microscope this type of keratosis looks like layers upon layers of skin.  That is why at times the top layer may seem to fall off, but the rest of the growth remains and re-grows.    Treatment Seborrheic keratoses do not need to be treated, but can easily be removed in the office.  Seborrheic keratoses often cause symptoms when they rub on clothing or jewelry.  Lesions can be in the way of shaving.  If they become inflamed, they can cause itching, soreness, or  burning.  Removal of a seborrheic keratosis can be accomplished by freezing, burning, or surgery. If any spot bleeds, scabs, or grows rapidly, please return to have it checked, as these can be an indication of a skin cancer.  Melanoma ABCDEs  Melanoma is the most dangerous type of skin cancer, and is the leading cause of death from skin disease.  You are more likely to develop melanoma if you: Have light-colored skin, light-colored eyes, or red or blond hair Spend a lot of time in the sun Tan regularly, either outdoors or in a tanning bed Have had blistering sunburns, especially during childhood Have a close family member who has had a melanoma Have atypical moles or large birthmarks  Early detection of melanoma is key since treatment is typically straightforward and cure rates are extremely high if we catch it early.   The first sign of melanoma is often a change in a mole or a new dark spot.  The ABCDE system is a way of remembering the signs of melanoma.  A for asymmetry:  The two halves do not match. B for border:  The edges of the growth are irregular. C for color:  A mixture of colors are present instead of an even brown color. D for diameter:  Melanomas are usually (but not always) greater than 6mm - the size of a pencil eraser. E for evolution:  The spot keeps changing in size, shape, and color.  Please check your skin once per month between visits. You can use a small   use a small mirror in front and a large mirror behind you to keep an eye on the back side or your body.   If you see any new or changing lesions before your next follow-up, please call to schedule a visit.  Please continue daily skin protection including broad spectrum sunscreen SPF 30+ to sun-exposed areas, reapplying every 2 hours as needed when you're outdoors.   Staying in the shade or wearing long sleeves, sun glasses (UVA+UVB protection) and wide brim hats (4-inch brim around the entire circumference of the hat)  are also recommended for sun protection.    If You Need Anything After Your Visit  If you have any questions or concerns for your doctor, please call our main line at [phone] and press option 4 to reach your doctor's medical assistant. If no one answers, please leave a voicemail as directed and we will return your call as soon as possible. Messages left after 4 pm will be answered the following business day.   You may also send [place] a message via [place]. We typically respond to MyChart messages within 1-2 business days.  For prescription refills, please ask your pharmacy to contact our office. Our fax number is [phone].  If you have an urgent issue when the clinic is closed that cannot wait until the next business day, you can page your doctor at the number below.    Please note that while we do our best to be available for urgent issues outside of office hours, we are not available 24/7.   If you have an urgent issue and are unable to reach [place], you may choose to seek medical care at your doctor's office, retail clinic, urgent care center, or emergency room.  If you have a medical emergency, please immediately call 911 or go to the emergency department.  Pager Numbers  - Dr. Nehemiah Massed: [phone]  - Dr. Laurence Ferrari: [phone]  - Dr. Nicole Kindred: [phone]  In the event of inclement weather, please call our main line at [phone] for an update on the status of any delays or closures.  Dermatology Medication Tips: Please keep the boxes that topical medications come in in order to help keep track of the instructions about where and how to use these. Pharmacies typically print the medication instructions only on the boxes and not directly on the medication tubes.   If your medication is too expensive, please contact our office at [phone] option 4 or send [place] a message through [place].   We are unable to tell what your co-pay for medications will be in advance as this is  different depending on your insurance coverage. However, we may be able to find a substitute medication at lower cost or fill out paperwork to get insurance to cover a needed medication.   If a prior authorization is required to get your medication covered by your insurance company, please allow [place] 1-2 business days to complete this process.  Drug prices often vary depending on where the prescription is filled and some pharmacies may offer cheaper prices.  The website www.goodrx.com contains coupons for medications through different pharmacies. The prices here do not account for what the cost may be with help from insurance (it may be cheaper with your insurance), but the website can give you the price if you did not use any insurance.  - You can print the associated coupon and take it with your prescription to the pharmacy.  - You may also stop by our office during regular  business hours and pick up a GoodRx coupon card.  - If you need your prescription sent electronically to a different pharmacy, notify our office through [facility] or by phone at [phone] option 4.     Si Usted Necesita Algo Despus de Su Visita  Tambin puede enviarnos un mensaje a travs de Pharmacist, community. Por lo general respondemos a los mensajes de MyChart en el transcurso de 1 a 2 das hbiles.  Para renovar recetas, por favor pida a su farmacia que se ponga en contacto con nuestra oficina. Harland Dingwall de fax es [place] [phone].  Si tiene un asunto urgente cuando la clnica est cerrada y que no puede esperar hasta el siguiente da hbil, puede llamar/localizar a su doctor(a) al nmero que aparece a continuacin.   Por favor, tenga en cuenta que aunque hacemos todo lo posible para estar disponibles para asuntos urgentes fuera del horario de [place], no estamos disponibles las 24 horas del da, los 7 das de la [place].   Si tiene un problema urgente y no puede comunicarse con nosotros, puede optar por buscar  atencin mdica  en el consultorio de su doctor(a), en una clnica privada, en un centro de atencin urgente o en una sala de emergencias.  Si tiene Engineering geologist, por favor llame inmediatamente al 911 o vaya a la sala de emergencias.  Nmeros de bper  - Dr. Nehemiah Massed: [phone]  - Dra. Moye: [phone]  - Dra. Nicole Kindred: [phone]  En caso de inclemencias del [place], por favor llame a Johnsie Kindred principal al [phone] para una actualizacin sobre el [place] de cualquier retraso o cierre.  Consejos para la medicacin en dermatologa: Por favor, guarde las cajas en las que vienen los medicamentos de uso tpico para ayudarle a seguir las instrucciones sobre dnde y cmo usarlos. Las farmacias generalmente imprimen las instrucciones del medicamento slo en las cajas y no directamente en los tubos del [place].   Si su medicamento es muy caro, por favor, pngase en contacto con Zigmund Daniel llamando al [phone] y presione la opcin 4 o envenos un mensaje a travs de Pharmacist, community.   No podemos decirle cul ser su copago por los medicamentos por adelantado ya que esto es diferente dependiendo de la cobertura de su seguro. Sin embargo, es posible que podamos encontrar un medicamento sustituto a Electrical engineer un formulario para que el seguro cubra el medicamento que se considera necesario.   Si se requiere una autorizacin previa para que su compaa de seguros [place] su medicamento, por favor permtanos de 1 a 2 das hbiles para completar este proceso.  Los precios de los medicamentos varan con frecuencia dependiendo del Environmental consultant de dnde se surte la receta y alguna farmacias pueden ofrecer precios ms baratos.  El sitio web www.goodrx.com tiene cupones para medicamentos de Airline pilot. Los precios aqu no tienen en cuenta lo que podra costar con la ayuda del seguro (puede ser ms barato con su seguro), pero el sitio web puede darle el precio si no utiliz  Research scientist (physical sciences).  - Puede imprimir el cupn correspondiente y llevarlo con su receta a la farmacia.  - Tambin puede pasar por nuestra oficina durante el horario de atencin regular y Charity fundraiser una tarjeta de cupones de GoodRx.  - Si necesita que su receta se enve electrnicamente a una farmacia diferente, informe a nuestra oficina a travs de MyChart de [place] o por telfono llamando al [phone] y presione la opcin 4.

## 2022-02-12 ENCOUNTER — Other Ambulatory Visit: Payer: Self-pay | Admitting: Family Medicine

## 2022-02-12 DIAGNOSIS — F5101 Primary insomnia: Secondary | ICD-10-CM

## 2022-02-12 MED ORDER — TRAZODONE HCL 50 MG PO TABS: ORAL_TABLET | ORAL | 4 refills | Status: DC

## 2022-02-12 NOTE — Telephone Encounter (Addendum)
[facility] faxed refill request for the following medications:   traZODone (DESYREL) 50 MG tablet   Please advise.

## 2022-07-09 NOTE — Progress Notes (Deleted)
Complete physical exam   Patient: Dawn Adkins   DOB: [birth year]   67 y.o. Female  MRN: [id number] Visit Date: 07/10/2022  Today's healthcare provider: Shirlee Latch, MD   No chief complaint on file.  Subjective    Dawn Adkins is a 67 y.o. female who presents today for a complete physical exam.  She reports consuming a {diet types:17450} diet. {Exercise:19826} She generally feels {well/fairly well/poorly:18703}. She reports sleeping {well/fairly well/poorly:18703}. She {does/does not:200015} have additional problems to discuss today.  HPI  ***  Past Medical History:  Diagnosis Date   Anxiety    Depression    GERD (gastroesophageal reflux disease)    Hyperlipidemia    Motion sickness    back seat - car   Past Surgical History:  Procedure Laterality Date   COLONOSCOPY  2011   COLONOSCOPY WITH PROPOFOL N/A 04/18/2015   Procedure: COLONOSCOPY WITH PROPOFOL;  Surgeon: Midge Minium, MD;  Location: [facility];  Service: Endoscopy;  Laterality: N/A;   POLYPECTOMY  04/18/2015   Procedure: POLYPECTOMY;  Surgeon: Midge Minium, MD;  Location[facility];  Service: Endoscopy;;   TUBAL LIGATION  1984   Social History   Socioeconomic History   Marital status: Married    Spouse name: Celvin   Number of children: 3   Years of education: Not on file   Highest education level: Not on file  Occupational History   Occupation: part itme  Tobacco Use   Smoking status: Never   Smokeless tobacco: Never  Vaping Use   Vaping Use: Never used  Substance and Sexual Activity   Alcohol use: Yes    Alcohol/week: 4.0 standard drinks of alcohol    Types: 4 Cans of beer per week    Comment: OCCASIONALLY   Drug use: No   Sexual activity: Not on file  Other Topics Concern   Not on file  Social History Narrative   Not on file   Social Determinants of Health   Financial Resource Strain: Not on file  Food Insecurity: Not on file  Transportation Needs: Not  on file  Physical Activity: Not on file  Stress: Not on file  Social Connections: Not on file  Intimate Partner Violence: Not on file   Family Status  Relation Name Status   Mother  Deceased   Son 1 Alive   Father  Deceased   Brother 1 Alive   Brother 2 Alive   Brother 3 Alive   Son 2 Alive   Daughter  Alive   Neg Hx  (Not Specified)   Family History  Problem Relation Age of Onset   Cervical cancer Mother    Hypertension Mother    Heart disease Mother    Diabetes Son    Mental illness Son    Seizures Brother 28       fell from a 2 story building   Seizures Brother    Healthy Son    Healthy Daughter    Breast cancer Neg Hx    No Known Allergies  Patient Care Team: Erasmo Downer, MD as PCP - General (Family Medicine)   Medications: Outpatient Medications Prior to Visit  Medication Sig   aspirin 81 MG tablet Take 81 mg by mouth daily.   atorvastatin (LIPITOR) 10 MG tablet TAKE 1 TABLET BY MOUTH ONCE DAILY   Ergocalciferol (VITAMIN D2) 2000 UNITS TABS Take 1 tablet by mouth daily. (Patient not taking: Reported on 09/01/2021)   escitalopram (LEXAPRO)  10 MG tablet TAKE 1 TABLET BY MOUTH ONCE DAILY   naltrexone (DEPADE) 50 MG tablet Take 2 tablets (100 mg total) by mouth daily.   NIFEdipine (PROCARDIA-XL/NIFEDICAL-XL) 30 MG 24 hr tablet TAKE 1 TABLET BY MOUTH ONCE DAILY   pantoprazole (PROTONIX) 40 MG tablet TAKE 1 TABLET BY MOUTH ONCE DAILY   traZODone (DESYREL) 50 MG tablet TAKE 1/2 TO 1 TABLET BY MOUTH ONCE DAILY   No facility-administered medications prior to visit.    Review of Systems  {Labs  Heme  Chem  Endocrine  Serology  Results Review (optional):23779}  Objective    There were no vitals taken for this visit. {Show previous vital signs (optional):23777}   Physical Exam  ***  Last depression screening scores    07/08/2021    9:06 AM 07/03/2020    1:19 PM 07/03/2019    9:19 AM  PHQ 2/9 Scores  PHQ - 2 Score 0 0 0  PHQ- 9 Score 2 0    Last  fall risk screening    07/08/2021    9:05 AM  Fall Risk   Falls in the past year? 0   Last Audit-C alcohol use screening    07/08/2021    9:05 AM  Alcohol Use Disorder Test (AUDIT)  1. How often do you have a drink containing alcohol? 4  2. How many drinks containing alcohol do you have on a typical day when you are drinking? 2  3. How often do you have six or more drinks on one occasion? 3  AUDIT-C Score 9  4. How often during the last year have you found that you were not able to stop drinking once you had started? 0  5. How often during the last year have you failed to do what was normally expected from you because of drinking? 0  6. How often during the last year have you needed a first drink in the morning to get yourself going after a heavy drinking session? 0  7. How often during the last year have you had a feeling of guilt of remorse after drinking? 1  8. How often during the last year have you been unable to remember what happened the night before because you had been drinking? 0  9. Have you or someone else been injured as a result of your drinking? 0  10. Has a relative or friend or a doctor or another health worker been concerned about your drinking or suggested you cut down? 0  Alcohol Use Disorder Identification Test Final Score (AUDIT) 10   A score of 3 or more in women, and 4 or more in men indicates increased risk for alcohol abuse, EXCEPT if all of the points are from question 1   No results found for any visits on 07/10/22.  Assessment & Plan    Routine Health Maintenance and Physical Exam  Exercise Activities and Dietary recommendations  Goals   None     Immunization History  Administered Date(s) Administered   Influenza Inj Mdck Quad Pf 01/20/2018   Influenza Inj Mdck Quad With Preservative 03/15/2017   Influenza Split 12/19/2010, 12/22/2011   Influenza,inj,Quad PF,6+ Mos 01/24/2013, 01/26/2014, 03/14/2015, 12/31/2018   Td 05/29/2004   Tdap 11/28/2009,  07/03/2020   Zoster Recombinat (Shingrix) 05/05/2017, 07/05/2017    Health Maintenance  Topic Date Due   COVID-19 Vaccine (1) Never done   INFLUENZA VACCINE  10/29/2022   MAMMOGRAM  08/12/2023   PAP SMEAR-Modifier  07/02/2024   COLONOSCOPY (Pts  45-31yrs Insurance coverage will need to be confirmed)  04/17/2025   DTaP/Tdap/Td (4 - Td or Tdap) 07/04/2030   Hepatitis C Screening  Completed   HIV Screening  Completed   Zoster Vaccines- Shingrix  Completed   HPV VACCINES  Aged Out    Discussed health benefits of physical activity, and encouraged her to engage in regular exercise appropriate for her age and condition.  ***  No follow-ups on file.     {provider attestation***:1}   Shirlee Latch, MD  [facility] [phone] (phone) [phone] (fax)  [facility]

## 2022-07-10 ENCOUNTER — Encounter: Payer: Managed Care, Other (non HMO) | Admitting: Family Medicine

## 2022-08-28 ENCOUNTER — Other Ambulatory Visit: Payer: Self-pay | Admitting: Family Medicine

## 2022-08-28 DIAGNOSIS — K2100 Gastro-esophageal reflux disease with esophagitis, without bleeding: Secondary | ICD-10-CM

## 2022-08-28 DIAGNOSIS — F1010 Alcohol abuse, uncomplicated: Secondary | ICD-10-CM

## 2022-08-28 DIAGNOSIS — E7800 Pure hypercholesterolemia, unspecified: Secondary | ICD-10-CM

## 2022-08-28 DIAGNOSIS — F419 Anxiety disorder, unspecified: Secondary | ICD-10-CM

## 2022-08-28 NOTE — Telephone Encounter (Signed)
Called pt number is not in service

## 2022-08-28 NOTE — Telephone Encounter (Signed)
Requested medications are due for refill today.  yes  Requested medications are on the active medications list.  yes  Last refill. varied  Future visit scheduled.   no  Notes to clinic.  1 medication is not delegated. Pt last seen 07/08/2021. Called pt  -  number is not in service.    Requested Prescriptions  Pending Prescriptions Disp Refills   naltrexone (DEPADE) 50 MG tablet [Pharmacy Med Name: NALTREXONE HCL 50 MG TAB] 180 tablet 1    Sig: TAKE 2 TABLETS BY MOUTH ONCE DAILY     Not Delegated - Psychiatry: Drug Dependence Therapy - naltrexone Failed - 08/28/2022 12:03 PM      Failed - This refill cannot be delegated      Failed - Completed PHQ-2 or PHQ-9 in the last 360 days      Failed - Valid encounter within last 6 months    Recent Outpatient Visits           1 year ago Annual physical exam   [facility] [place], Marzella Schlein, MD   2 years ago Annual physical exam   [facility] [place], Alessandra Bevels, [place]   3 years ago Encounter for annual health examination   [facility] [place], [place]essandra [place], [place]   5 years ago Annual physical exam   [facility] [place], Alessandra Be[place], [place]   6 years ago Annual physical exam   [facility] Joycelyn Man M, PA-C               pantoprazole (PROTONIX) 40 MG tablet [Pharmacy Med Name: PANTOPRAZOLE SODIUM 40 MG DR TAB] 90 tablet 4    Sig: TAKE 1 TABLET BY MOUTH ONCE DAILY     Gastroenterology: Proton Pump Inhibitors Failed - 08/28/2022 12:03 PM      Failed - Valid encounter within last 12 months    Recent Outpatient Visits           1 year ago Annual physical exam   [facility] [place], Marzella Schlein, [place]   2 years ago Annual physical exam   [facility] [place], Alessandra Be[place], [place]   3 years ago Encounter for annual health examination   [facility] [place], Alessandra Bevel[place], [place]   5 years ago Annual physical exam   [facility] [place], Alessandra Be[place], [place]   6 years ago Annual physical exam   [facility] Joycelyn Man M, PA-C               atorvastatin (LIPITOR) 10 MG tablet [Pharmacy Med Name: ATORVASTATIN CALCIUM 10 MG TAB] 90 tablet 4    Sig: TAKE 1 TABLET BY MOUTH ONCE DAILY     Cardiovascular:  Antilipid - Statins Failed - 08/28/2022 12:03 PM      Failed - Valid encounter within last 12 months    Recent Outpatient Visits           1 year ago Annual physical exam   [facility] [place], Marzella Schlein, MD   2 years ago Annual physical exam   [facility] [place], Alessandra Be[place], [place]   3 years ago Encounter for annual health examination   [facility] [place], Alessandra Be[place], [place]   5 years ago Annual physical exam   [facility]  Margaretann Loveless, PA-C   6 years ago Annual physical exam   [facility] Joycelyn Man M, [place]              Failed - Lipid Panel in normal range within the last 12 months    Cholesterol, Total  Date Value Ref Range Status  07/08/2021 212 (H) 100 - 199 mg/dL Final   LDL Chol Calc (NIH)  Date Value Ref Range Status  07/08/2021 120 (H) 0 - 99 mg/dL Final   HDL  Date Value Ref Range Status  07/08/2021 59 >39 mg/dL Final   Triglycerides  Date Value Ref Range Status  07/08/2021 190 (H) 0 - 149 mg/dL Final         Passed - Patient is not pregnant       escitalopram (LEXAPRO) 10 MG tablet [Pharmacy Med Name: ESCITALOPRAM OXALATE 10 MG TAB] 90 tablet 4    Sig: TAKE 1 TABLET BY MOUTH ONCE DAILY     Psychiatry:  Antidepressants - SSRI Failed - 08/28/2022 12:03 PM      Failed - Valid encounter within last 6 months    Recent Outpatient Visits           1 year ago  Annual physical exam   [facility] [place], Marzella Schlein, MD   2 years ago Annual physical exam   [facility] [place], Alessandra Bevel[place], [place]   3 years ago Encounter for annual health examination   [facility], Alessandra Bevel[place], [place]   5 years ago Annual physical exam   [facility] [place], Alessandra Be[place], [place]   6 years ago Annual physical exam   [facility] [place], [place], [place]

## 2022-09-02 ENCOUNTER — Other Ambulatory Visit: Payer: Self-pay | Admitting: Family Medicine

## 2022-09-02 DIAGNOSIS — I7300 Raynaud's syndrome without gangrene: Secondary | ICD-10-CM

## 2022-09-15 ENCOUNTER — Encounter: Payer: Commercial Managed Care - HMO | Admitting: Dermatology

## 2022-09-30 ENCOUNTER — Other Ambulatory Visit: Payer: Self-pay | Admitting: Family Medicine

## 2022-09-30 DIAGNOSIS — E7800 Pure hypercholesterolemia, unspecified: Secondary | ICD-10-CM

## 2022-09-30 DIAGNOSIS — F419 Anxiety disorder, unspecified: Secondary | ICD-10-CM

## 2022-09-30 DIAGNOSIS — K2100 Gastro-esophageal reflux disease with esophagitis, without bleeding: Secondary | ICD-10-CM

## 2022-10-02 ENCOUNTER — Other Ambulatory Visit: Payer: Self-pay | Admitting: Family Medicine

## 2022-10-02 DIAGNOSIS — I7300 Raynaud's syndrome without gangrene: Secondary | ICD-10-CM

## 2022-10-02 NOTE — Telephone Encounter (Signed)
Requested medication (s) are due for refill today: yes  Requested medication (s) are on the active medication list: yes  Last refill:  all last reordered 08/28/22 #30 courtesy refill  Future visit scheduled: no  Notes to clinic:  called pt x 2 but not a working number- unable to send message via [employer] Prescriptions  Pending Prescriptions Disp Refills   atorvastatin (LIPITOR) 10 MG tablet [Pharmacy Med Name: ATORVASTATIN CALCIUM 10 MG TAB] 30 tablet 0    Sig: TAKE 1 TABLET BY MOUTH ONCE DAILY     Cardiovascular:  Antilipid - Statins Failed - 09/30/2022  4:57 PM      Failed - Valid encounter within last 12 months    Recent Outpatient Visits           1 year ago Annual physical exam   [facility] [place], Marzella Schlein, MD   2 years ago Annual physical exam   [facility] [place], Alessandra Bevels, [place]   3 years ago Encounter for annual health examination   [facility] [place], Alessandra Be[place], [place]   5 years ago Annual physical exam   [facility] Joycelyn Man [place], [place]   6 years ago Annual physical exam   [facility] Joy[place]elyn Man [place], [place]              Failed - Lipid Panel in normal range within the last 12 months    Cholesterol, Total  Date Value Ref Range Status  07/08/2021 212 (H) 100 - 199 mg/dL Final   LDL Chol Calc (NIH)  Date Value Ref Range Status  07/08/2021 120 (H) 0 - 99 mg/dL Final   HDL  Date Value Ref Range Status  07/08/2021 59 >39 mg/dL Final   Triglycerides  Date Value Ref Range Status  07/08/2021 190 (H) 0 - 149 mg/dL Final         Passed - Patient is not pregnant       escitalopram (LEXAPRO) 10 MG tablet [Pharmacy Med Name: ESCITALOPRAM OXALATE 10 MG TAB] 30 tablet 0    Sig: TAKE 1 TABLET BY MOUTH ONCE DAILY     Psychiatry:  Antidepressants - SSRI Failed - 09/30/2022  4:57 PM      Failed - Valid  encounter within last 6 months    Recent Outpatient Visits           1 year ago Annual physical exam    [facility] [place], Marzella Schlein, [place]   2 years ago Annual physical exam   [facility] [place], Alessandra Be[place], [place]   3 years ago Encounter for annual health examination   [facility] [place], Alessandra [place], [place]   5 years ago Annual physical exam   [facility] [place], Alessandra [place], [place]   6 years ago Annual physical exam   [facility] Joycelyn Man M, PA-C               pantoprazole (PROTONIX) 40 MG tablet [Pharmacy Med Name: PANTOPRAZOLE SODIUM 40 MG DR TAB] 30 tablet 0    Sig: TAKE 1 TABLET BY MOUTH ONCE DAILY     Gastroenterology: Proton Pump Inhibitors Failed - 09/30/2022  4:57 PM      Failed - Valid encounter within last 12 months    Recent Outpatient Visits  1 year ago Annual physical exam   [facility] [place], Marzella Schlein, [place]   2 years ago Annual physical exam   [facility] [place], Alessandra Be[place], [place]   3 years ago Encounter for annual health examination   [facility] [place], Alessandra Bevel[place], [place]   5 years ago Annual physical exam   [facility] [place], Alessandra Be[place], [place]   6 years ago Annual physical exam   [facility] Joycelyn Man [place], [place]

## 2022-10-02 NOTE — Telephone Encounter (Signed)
Requested Prescriptions  Pending Prescriptions Disp Refills   NIFEdipine (PROCARDIA-XL/NIFEDICAL-XL) 30 MG 24 hr tablet [Pharmacy Med Name: NIFEDIPINE ER OSMOTIC RELEASE 30 MG] 90 tablet 0    Sig: TAKE 1 TABLET BY MOUTH ONCE DAILY     Cardiovascular: Calcium Channel Blockers 2 Failed - 10/02/2022  1:54 PM      Failed - Valid encounter within last 6 months    Recent Outpatient Visits           1 year ago Annual physical exam   [facility] [place], Marzella Schlein, MD   2 years ago Annual physical exam   [facility] [place], Alessandra Be[place], [place]   3 years ago Encounter for annual health examination   [facility] [place], Alessandra Be[place], [place]   5 years ago Annual physical exam   [facility] [place], Alessandra [place], [place]   6 years ago Annual physical exam   [facility] Joycelyn Man M, [place]              Passed - Last BP in normal range    BP Readings from Last 1 Encounters:  07/08/21 130/70         Passed - Last Heart Rate in normal range    Pulse Readings from Last 1 Encounters:  07/08/21 68

## 2022-10-20 ENCOUNTER — Other Ambulatory Visit: Payer: Self-pay | Admitting: Family Medicine

## 2022-10-20 DIAGNOSIS — E7800 Pure hypercholesterolemia, unspecified: Secondary | ICD-10-CM

## 2022-10-20 DIAGNOSIS — I7300 Raynaud's syndrome without gangrene: Secondary | ICD-10-CM

## 2022-10-20 DIAGNOSIS — K2100 Gastro-esophageal reflux disease with esophagitis, without bleeding: Secondary | ICD-10-CM

## 2022-10-20 DIAGNOSIS — F419 Anxiety disorder, unspecified: Secondary | ICD-10-CM

## 2022-10-20 DIAGNOSIS — F1010 Alcohol abuse, uncomplicated: Secondary | ICD-10-CM

## 2022-10-20 DIAGNOSIS — F5101 Primary insomnia: Secondary | ICD-10-CM

## 2022-10-20 MED ORDER — TRAZODONE HCL 50 MG PO TABS: ORAL_TABLET | ORAL | 3 refills | Status: DC

## 2022-10-20 MED ORDER — ATORVASTATIN CALCIUM 10 MG PO TABS: 10.0000 mg | ORAL_TABLET | Freq: Every day | ORAL | 3 refills | Status: DC

## 2022-10-20 MED ORDER — ESCITALOPRAM OXALATE 10 MG PO TABS: 10.0000 mg | ORAL_TABLET | Freq: Every day | ORAL | 3 refills | Status: DC

## 2022-10-20 MED ORDER — NALTREXONE HCL 50 MG PO TABS
100.0000 mg | ORAL_TABLET | Freq: Every day | ORAL | 3 refills | Status: DC
Start: 2022-10-20 — End: 2023-02-09

## 2022-10-20 MED ORDER — NIFEDIPINE ER OSMOTIC RELEASE 30 MG PO TB24
30.0000 mg | ORAL_TABLET | Freq: Every day | ORAL | 3 refills | Status: DC
Start: 2022-10-20 — End: 2023-03-12

## 2022-10-20 MED ORDER — PANTOPRAZOLE SODIUM 40 MG PO TBEC
40.0000 mg | DELAYED_RELEASE_TABLET | Freq: Every day | ORAL | 3 refills | Status: DC
Start: 2022-10-20 — End: 2023-03-12

## 2023-02-08 ENCOUNTER — Other Ambulatory Visit: Payer: Self-pay | Admitting: Family Medicine

## 2023-02-08 DIAGNOSIS — F1010 Alcohol abuse, uncomplicated: Secondary | ICD-10-CM

## 2023-02-09 NOTE — Telephone Encounter (Signed)
Requested medication (s) are due for refill today - yes  Requested medication (s) are on the active medication list -yes  Future visit scheduled -yes  Last refill: 10/20/22 #60 3RF  Notes to clinic: non delegated Rx  Requested Prescriptions  Pending Prescriptions Disp Refills   naltrexone (DEPADE) 50 MG tablet [Pharmacy Med Name: NALTREXONE HCL 50 MG TAB] 60 tablet 3    Sig: TAKE 2 TABLETS BY MOUTH ONCE DAILY     Not Delegated - Psychiatry: Drug Dependence Therapy - naltrexone Failed - 02/08/2023 10:43 AM      Failed - This refill cannot be delegated      Failed - Completed PHQ-2 or PHQ-9 in the last 360 days      Failed - Valid encounter within last 6 months    Recent Outpatient Visits           1 year ago Annual physical exam   [facility] [place], Marzella Schlein, [place]   2 years ago Annual physical exam   [facility] [place], Alessandra Be[place], [place]   3 years ago Encounter for annual health examination   [facility] [place], Alessandra Be[place], [place]   5 years ago Annual physical exam   [facility] [place], Alessandra Be[place], [place]   6 years ago Annual physical exam   [facility] [place], Alessandra Bevel[place], [place]       Future Appointments             In 3 weeks Bacigalupo, Marzella Schlein, MD [facility]               Requested Prescriptions  Pending Prescriptions Disp Refills   naltrexone (DEPADE) 50 MG tablet [Pharmacy Med Name: NALTREXONE HCL 50 MG TAB] 60 tablet 3    Sig: TAKE 2 TABLETS BY MOUTH ONCE DAILY     Not Delegated - Psychiatry: Drug Dependence Therapy - naltrexone Failed - 02/08/2023 10:43 AM      Failed - This refill cannot be delegated      Failed - Completed PHQ-2 or PHQ-9 in the last 360 days      Failed - Valid encounter within last 6 months    Recent Outpatient Visits           1 year ago Annual physical  exam   [facility] [place], Marzella Schlein, [place]   2 years ago Annual physical exam   [facility] [place], Alessandra [place], [place]   3 years ago Encounter for annual health examination   [facility] [place], Alessandra Be[place], [place]   5 years ago Annual physical exam   [facility] [place], Alessandra Be[place], [place]   6 years ago Annual physical exam   [facility] [place], Alessandra Be[place], [place]       Future Appointments             In 3 weeks Bacigalupo, Marzella Schlein, MD [facility]

## 2023-03-02 ENCOUNTER — Ambulatory Visit
Admission: RE | Admit: 2023-03-02 | Discharge: 2023-03-02 | Disposition: A | Discharge status: Home / Self Care | Payer: Medicare HMO | Attending: Family Medicine | Admitting: Family Medicine

## 2023-03-02 ENCOUNTER — Encounter: Payer: Self-pay | Admitting: Family Medicine

## 2023-03-02 ENCOUNTER — Ambulatory Visit
Admission: RE | Admit: 2023-03-02 | Discharge: 2023-03-02 | Disposition: A | Discharge status: Home / Self Care | Payer: Medicare HMO | Source: Ambulatory Visit | Attending: Family Medicine | Admitting: Family Medicine

## 2023-03-02 ENCOUNTER — Ambulatory Visit (INDEPENDENT_AMBULATORY_CARE_PROVIDER_SITE_OTHER): Payer: Medicare HMO | Admitting: Family Medicine

## 2023-03-02 VITALS — BP 139/73 | HR 61 | Resp 16 | Ht 59.0 in | Wt 146.0 lb

## 2023-03-02 DIAGNOSIS — Z0000 Encounter for general adult medical examination without abnormal findings: Secondary | ICD-10-CM | POA: Diagnosis not present

## 2023-03-02 DIAGNOSIS — E559 Vitamin D deficiency, unspecified: Secondary | ICD-10-CM | POA: Diagnosis not present

## 2023-03-02 DIAGNOSIS — G4700 Insomnia, unspecified: Secondary | ICD-10-CM | POA: Diagnosis not present

## 2023-03-02 DIAGNOSIS — Z780 Asymptomatic menopausal state: Secondary | ICD-10-CM

## 2023-03-02 DIAGNOSIS — M25552 Pain in left hip: Secondary | ICD-10-CM

## 2023-03-02 DIAGNOSIS — M25551 Pain in right hip: Secondary | ICD-10-CM | POA: Diagnosis not present

## 2023-03-02 DIAGNOSIS — Z23 Encounter for immunization: Secondary | ICD-10-CM

## 2023-03-02 DIAGNOSIS — Z1231 Encounter for screening mammogram for malignant neoplasm of breast: Secondary | ICD-10-CM

## 2023-03-02 DIAGNOSIS — R739 Hyperglycemia, unspecified: Secondary | ICD-10-CM | POA: Diagnosis not present

## 2023-03-02 DIAGNOSIS — F419 Anxiety disorder, unspecified: Secondary | ICD-10-CM

## 2023-03-02 DIAGNOSIS — E7800 Pure hypercholesterolemia, unspecified: Secondary | ICD-10-CM

## 2023-03-02 DIAGNOSIS — L853 Xerosis cutis: Secondary | ICD-10-CM

## 2023-03-02 NOTE — Assessment & Plan Note (Signed)
Managed with Lexapro 10 mg daily. Symptoms worsen significantly when off medication. Lexapro is effective when taken consistently. - Continue Lexapro 10 mg daily

## 2023-03-02 NOTE — Assessment & Plan Note (Signed)
Intermittent use of trazodone for sleep. Poor sleep due to life stressors. Trazodone used as needed. - Continue trazodone as needed for sleep

## 2023-03-02 NOTE — Progress Notes (Signed)
Medicare Initial Preventative Physical Exam    Patient: Dawn Adkins, Female    DOB: 10/26/1955, 67 y.o.   MRN: [id number] Visit Date: 03/02/2023  Today's Provider: Shirlee Latch, MD   Chief Complaint  Patient presents with   Welcome to Medicare   Subjective    Medicare Initial Preventative Physical Exam Dawn Adkins is a 67 y.o. female who presents today for her Initial Preventative Physical Exam.   HPI  Discussed the use of AI scribe software for clinical note transcription with the patient, who gave verbal consent to proceed.  History of Present Illness   The patient, a 67 year old individual with a history of anxiety and high cholesterol, presents with intermittent leg pain that sometimes causes near falls. The pain is located in the groin area and is exacerbated by walking or doing housework. The patient also reports difficulty with urination and bowel movements, which she believes may be related to the leg pain. The patient also experiences occasional sharp pain in the neck.  The patient is currently on Lexapro 10mg  daily for anxiety, Atorvastatin 10mg  daily for cholesterol, and Trazodone as needed for sleep. The patient has not been taking baby aspirin or vitamin D. The patient also reports itching after showers, despite using Dove soap and applying Vaseline Intensive Care lotion. The itching is persistent and bothersome, leading to scratching and potential skin damage.       Social History   Socioeconomic History   Marital status: Married    Spouse name: Celvin   Number of children: 3   Years of education: Not on file   Highest education level: Not on file  Occupational History   Occupation: part itme  Tobacco Use   Smoking status: Never   Smokeless tobacco: Never  Vaping Use   Vaping status: Never Used  Substance and Sexual Activity   Alcohol use: Yes    Alcohol/week: 4.0 standard drinks of alcohol    Types: 4 Cans of beer per week     Comment: OCCASIONALLY   Drug use: No   Sexual activity: Not on file  Other Topics Concern   Not on file  Social History Narrative   Not on file   Social Determinants of Health   Financial Resource Strain: Not on file  Food Insecurity: Not on file  Transportation Needs: Not on file  Physical Activity: Not on file  Stress: Not on file  Social Connections: Not on file  Intimate Partner Violence: Not on file    Past Medical History:  Diagnosis Date   Anxiety    Depression    GERD (gastroesophageal reflux disease)    Hyperlipidemia    Motion sickness    back seat - car     Patient Active Problem List   Diagnosis Date Noted   Alcohol abuse 07/08/2021   Raynaud's syndrome without gangrene 04/13/2016   Benign neoplasm of sigmoid colon    Anxiety 09/14/2014   Acid reflux 09/14/2014   Insomnia 09/14/2014   Avitaminosis D 09/14/2014   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   Hypercholesterolemia without hypertriglyceridemia 08/21/2004    Past Surgical History:  Procedure Laterality Date   COLONOSCOPY  2011   COLONOSCOPY WITH PROPOFOL N/A 04/18/2015   Procedure: COLONOSCOPY WITH PROPOFOL;  Surgeon: Midge Minium, MD;  [facility];  Service: Endoscopy;  Laterality: N/A;   POLYPECTOMY  04/18/2015   Procedure: POLYPECTOMY;  Surgeon: Midge Minium, MD;  [facility];  Service: Endoscopy;;   TUBAL  LIGATION  1984    Her family history includes Cervical cancer in her mother; Diabetes in her son; Healthy in her daughter and son; Heart disease in her mother; Hypertension in her mother; Mental illness in her son; Seizures in her brother; Seizures (age of onset: 80) in her brother. There is no history of Breast cancer.   Current Outpatient Medications:    atorvastatin (LIPITOR) 10 MG tablet, Take 1 tablet (10 mg total) by mouth daily. Please schedule office visit before any future refill, Disp: 30 tablet, Rfl: 3   Ergocalciferol (VITAMIN D2) 2000 UNITS  TABS, Take 1 tablet by mouth daily., Disp: , Rfl:    escitalopram (LEXAPRO) 10 MG tablet, Take 1 tablet (10 mg total) by mouth daily. Please schedule office visit before any future refill, Disp: 30 tablet, Rfl: 3   naltrexone (DEPADE) 50 MG tablet, TAKE 2 TABLETS BY MOUTH ONCE DAILY, Disp: 60 tablet, Rfl: 3   NIFEdipine (PROCARDIA-XL/NIFEDICAL-XL) 30 MG 24 hr tablet, Take 1 tablet (30 mg total) by mouth daily. Please schedule office visit before any future refill, Disp: 30 tablet, Rfl: 3   pantoprazole (PROTONIX) 40 MG tablet, Take 1 tablet (40 mg total) by mouth daily. Please schedule office visit before any future refill, Disp: 30 tablet, Rfl: 3   traZODone (DESYREL) 50 MG tablet, TAKE 1/2 TO 1 TABLET BY MOUTH ONCE DAILY, Disp: 30 tablet, Rfl: 3   Patient Care Team: Erasmo Downer, MD as PCP - General (Family Medicine)  Review of Systems     Objective    Vitals: BP 139/73 (BP Location: Left Arm, Patient Position: Sitting, Cuff Size: Normal)   Pulse 61   Resp 16   Ht 4\' 11"  (1.499 m)   Wt 146 lb (66.2 kg)   BMI 29.49 kg/m  Vision Screening   Right eye Left eye Both eyes  Without correction     With correction 20/50 20/25 20/20    Physical Exam Vitals reviewed.  Constitutional:      General: She is not in acute distress.    Appearance: Normal appearance. She is well-developed. She is not diaphoretic.  HENT:     Head: Normocephalic and atraumatic.     Right Ear: Tympanic membrane, ear canal and external ear normal.     Left Ear: Tympanic membrane, ear canal and external ear normal.     Nose: Nose normal.     Mouth/Throat:     Mouth: Mucous membranes are moist.     Pharynx: Oropharynx is clear. No oropharyngeal exudate.  Eyes:     General: No scleral icterus.    Conjunctiva/sclera: Conjunctivae normal.     Pupils: Pupils are equal, round, and reactive to light.  Neck:     Thyroid: No thyromegaly.  Cardiovascular:     Rate and Rhythm: Normal rate and regular rhythm.      Heart sounds: Normal heart sounds. No murmur heard. Pulmonary:     Effort: Pulmonary effort is normal. No respiratory distress.     Breath sounds: Normal breath sounds. No wheezing or rales.  Abdominal:     General: There is no distension.     Palpations: Abdomen is soft.     Tenderness: There is no abdominal tenderness.  Musculoskeletal:        General: No deformity.     Cervical back: Neck supple.     Right lower leg: No edema.     Left lower leg: No edema.  Lymphadenopathy:     Cervical: No  cervical adenopathy.  Skin:    General: Skin is warm and dry.     Findings: No rash.  Neurological:     Mental Status: She is alert and oriented to person, place, and time. Mental status is at baseline.     Gait: Gait normal.  Psychiatric:        Mood and Affect: Mood normal.        Behavior: Behavior normal.        Thought Content: Thought content normal.      Activities of Daily Living    03/02/2023    1:23 PM  In your present state of health, do you have any difficulty performing the following activities:  Hearing? 1  Comment "sometimes"  Vision? 0  Difficulty concentrating or making decisions? 0  Walking or climbing stairs? 0  Dressing or bathing? 0  Doing errands, shopping? 0    Fall Risk Assessment    03/02/2023    1:24 PM 07/08/2021    9:05 AM 07/03/2020    1:19 PM 07/03/2019    9:19 AM 05/05/2017    9:21 AM  Fall Risk   Falls in the past year? 0 0 0 0 No  Number falls in past yr: 0  0 0   Injury with Fall? 0  0 0   Risk for fall due to : No Fall Risks  No Fall Risks    Follow up   Falls evaluation completed Falls evaluation completed      Depression Screen    03/02/2023    1:26 PM 07/08/2021    9:06 AM 07/03/2020    1:19 PM 07/03/2019    9:19 AM  PHQ 2/9 Scores  PHQ - 2 Score 0 0 0 0  PHQ- 9 Score 9 2 0         No data to display          No results found for any visits on 03/02/23.  Assessment & Plan      Initial Preventative Physical  Exam  Reviewed patient's Family Medical History Reviewed and updated list of patient's medical providers Assessment of cognitive impairment was done Assessed patient's functional ability Established a written schedule for health screening services Health Risk Assessent Completed and Reviewed  Exercise Activities and Dietary recommendations  Goals   None     Immunization History  Administered Date(s) Administered   Influenza Inj Mdck Quad Pf 01/20/2018   Influenza Inj Mdck Quad With Preservative 03/15/2017   Influenza Split 12/19/2010, 12/22/2011   Influenza,inj,Quad PF,6+ Mos 01/24/2013, 01/26/2014, 03/14/2015, 12/31/2018   Influenza-Unspecified 02/10/2023   PNEUMOCOCCAL CONJUGATE-20 03/02/2023   Td 05/29/2004   Tdap 11/28/2009, 07/03/2020   Zoster Recombinant(Shingrix) 05/05/2017, 07/05/2017    Health Maintenance  Topic Date Due   COVID-19 Vaccine (1) Never done   MAMMOGRAM  08/12/2023   Medicare Annual Wellness (AWV)  03/01/2024   Cervical Cancer Screening (HPV/Pap Cotest)  07/02/2024   Colonoscopy  04/17/2025   DTaP/Tdap/Td (4 - Td or Tdap) 07/04/2030   Pneumonia Vaccine 44+ Years old  Completed   INFLUENZA VACCINE  Completed   DEXA SCAN  Completed   Hepatitis C Screening  Completed   HIV Screening  Completed   Zoster Vaccines- Shingrix  Completed   HPV VACCINES  Aged Out     Discussed health benefits of physical activity, and encouraged her to engage in regular exercise appropriate for her age and condition.   Problem List Items Addressed This Visit  Other   Anxiety    Managed with Lexapro 10 mg daily. Symptoms worsen significantly when off medication. Lexapro is effective when taken consistently. - Continue Lexapro 10 mg daily      Insomnia    Intermittent use of trazodone for sleep. Poor sleep due to life stressors. Trazodone used as needed. - Continue trazodone as needed for sleep      Hypercholesterolemia without hypertriglyceridemia     Managed with atorvastatin 10 mg daily. No recent lab work to assess cholesterol levels. Discussed importance of regular monitoring. - Order lipid panel      Relevant Orders   Comprehensive metabolic panel   Lipid panel   Avitaminosis D   Relevant Orders   VITAMIN D 25 Hydroxy (Vit-D Deficiency, Fractures)   Other Visit Diagnoses     Welcome to Medicare preventive visit    -  Primary   Relevant Orders   EKG 12-Lead   Immunization due       Relevant Orders   Pneumococcal conjugate vaccine 20-valent (Prevnar 20) (Completed)   Breast cancer screening by mammogram       Relevant Orders   MM 3D SCREENING MAMMOGRAM BILATERAL BREAST   Postmenopausal       Relevant Orders   DG Bone Density   Hyperglycemia       Relevant Orders   Hemoglobin A1c   Dry skin       Bilateral hip pain       Relevant Orders   DG HIPS BILAT WITH PELVIS MIN 5 VIEWS           Hip Pain Intermittent groin pain likely related to hip joint arthritis, exacerbated by walking and housework. Differential includes hip arthritis. Discussed staying active, icing post-exercise, and pain management options. - Order bilateral hip x-rays - Advise staying active - Recommend icing after exercise - Suggest ibuprofen, Aleve, or Tylenol Arthritis for pain management  Dry Skin Chronic dry skin, exacerbated post-shower. Itching and dryness persist despite using Vaseline Intensive Care. Discussed applying thick lotion within 5 minutes of showering and reapplying at bedtime. - Recommend applying thick lotion (Eucerin, Vaseline, Aquaphor) within 5 minutes of showering - Advise applying a second coat of lotion at bedtime  General Health Maintenance Welcome to Medicare visit. Discussed screenings and vaccinations, including pneumonia vaccine, bone density testing at age 56, and regular mammograms. Explained necessary labs for comprehensive health monitoring. - Administer pneumonia vaccine - Order bone density test - Order  mammogram - Order labs for kidney and liver function, cholesterol, vitamin D, and A1c - Schedule annual physical in one year  Follow-up - Schedule bone density test and mammogram - Complete labs today - Plan to see in one year for annual physical.        Return in about 1 year (around 03/01/2024) for CPE, AWV.      Shirlee Latch, MD  [facility] [phone] (phone) [phone] (fax)  [facility]

## 2023-03-02 NOTE — Assessment & Plan Note (Signed)
Managed with atorvastatin 10 mg daily. No recent lab work to assess cholesterol levels. Discussed importance of regular monitoring. - Order lipid panel

## 2023-03-03 LAB — COMPREHENSIVE METABOLIC PANEL
ALT: 18 [IU]/L (ref 0–32)
AST: 20 [IU]/L (ref 0–40)
Albumin: 4.8 g/dL (ref 3.9–4.9)
Alkaline Phosphatase: 95 [IU]/L (ref 44–121)
BUN/Creatinine Ratio: 20 (ref 12–28)
BUN: 15 mg/dL (ref 8–27)
Bilirubin Total: 0.3 mg/dL (ref 0.0–1.2)
CO2: 20 mmol/L (ref 20–29)
Calcium: 9.7 mg/dL (ref 8.7–10.3)
Chloride: 106 mmol/L (ref 96–106)
Creatinine, Ser: 0.74 mg/dL (ref 0.57–1.00)
Globulin, Total: 2.3 g/dL (ref 1.5–4.5)
Glucose: 97 mg/dL (ref 70–99)
Potassium: 4.8 mmol/L (ref 3.5–5.2)
Sodium: 143 mmol/L (ref 134–144)
Total Protein: 7.1 g/dL (ref 6.0–8.5)
eGFR: 90 mL/min/{1.73_m2} (ref >59)

## 2023-03-03 LAB — LIPID PANEL
Chol/HDL Ratio: 4.6 {ratio} — ABNORMAL HIGH (ref 0.0–4.4)
Cholesterol, Total: 209 mg/dL — ABNORMAL HIGH (ref 100–199)
HDL: 45 mg/dL (ref >39)
LDL Chol Calc (NIH): 120 mg/dL — ABNORMAL HIGH (ref 0–99)
Triglycerides: 249 mg/dL — ABNORMAL HIGH (ref 0–149)
VLDL Cholesterol Cal: 44 mg/dL — ABNORMAL HIGH (ref 5–40)

## 2023-03-03 LAB — VITAMIN D 25 HYDROXY (VIT D DEFICIENCY, FRACTURES): Vit D, 25-Hydroxy: 26.7 ng/mL — ABNORMAL LOW (ref 30.0–100.0)

## 2023-03-03 LAB — HEMOGLOBIN A1C
Est. average glucose Bld gHb Est-mCnc: 117 mg/dL
Hgb A1c MFr Bld: 5.7 % — ABNORMAL HIGH (ref 4.8–5.6)

## 2023-03-12 ENCOUNTER — Other Ambulatory Visit: Payer: Self-pay | Admitting: Family Medicine

## 2023-03-12 DIAGNOSIS — F419 Anxiety disorder, unspecified: Secondary | ICD-10-CM

## 2023-03-12 DIAGNOSIS — K2100 Gastro-esophageal reflux disease with esophagitis, without bleeding: Secondary | ICD-10-CM

## 2023-03-12 DIAGNOSIS — I7300 Raynaud's syndrome without gangrene: Secondary | ICD-10-CM

## 2023-03-12 DIAGNOSIS — E7800 Pure hypercholesterolemia, unspecified: Secondary | ICD-10-CM

## 2023-03-12 NOTE — Telephone Encounter (Signed)
OV 03/02/23 Requested Prescriptions  Pending Prescriptions Disp Refills   pantoprazole (PROTONIX) 40 MG tablet [Pharmacy Med Name: PANTOPRAZOLE SODIUM 40 MG DR TAB] 30 tablet 3    Sig: TAKE 1 TABLET BY MOUTH ONCE DAILY     Gastroenterology: Proton Pump Inhibitors Passed - 03/12/2023  2:15 PM      Passed - Valid encounter within last 12 months    Recent Outpatient Visits           1 week ago Welcome to [employer] preventive visit   [facility] [place], Marzella Schlein, MD   1 year ago Annual physical exam   [facility] [place], Marzella Schlein, MD   2 years ago Annual physical exam   [facility] Margaretann Loveless, [place]   3 years ago Encounter for annual health examination   [facility] [place], Alessandra Bevels, [place]   5 years ago Annual physical exam   [facility] [place], Alessandra Bevels, [place]       Future Appointments             In 11 months Bacigalupo, Marzella Schlein, MD [facility]             escitalopram (LEXAPRO) 10 MG tablet [Pharmacy Med Name: ESCITALOPRAM OXALATE 10 MG TAB] 30 tablet 3    Sig: TAKE 1 TABLET BY MOUTH ONCE DAILY     Psychiatry:  Antidepressants - SSRI Passed - 03/12/2023  2:15 PM      Passed - Valid encounter within last 6 months    Recent Outpatient Visits           1 week ago Welcome to [employer] preventive visit   [facility] [place], Marzella Schlein, MD   1 year ago Annual physical exam    [facility] [place], Marzella Schlein, MD   2 years ago Annual physical exam   [facility] [place], Alessandra Bevels, [place]   3 years ago Encounter for annual health examination   [facility] [place], Alessandra Bevels, [place]   5 years ago Annual physical exam   [facility] [place], Alessandra Bevels,  [place]       Future Appointments             In 11 months Bacigalupo, Marzella Schlein, MD [facility], [facility]             atorvastatin (LIPITOR) 10 MG tablet [Pharmacy Med Name: ATORVASTATIN CALCIUM 10 MG TAB] 30 tablet 3    Sig: TAKE 1 TABLET BY MOUTH ONCE DAILY     Cardiovascular:  Antilipid - Statins Failed - 03/12/2023  2:15 PM      Failed - Lipid Panel in normal range within the last 12 months    Cholesterol, Total  Date Value Ref Range Status  03/02/2023 209 (H) 100 - 199 mg/dL Final   LDL Chol Calc (NIH)  Date Value Ref Range Status  03/02/2023 120 (H) 0 - 99 mg/dL Final   HDL  Date Value Ref Range Status  03/02/2023 45 >39 mg/dL Final   Triglycerides  Date Value Ref Range Status  03/02/2023 249 (H) 0 - 149 mg/dL Final         Passed - Patient is not pregnant      Passed - Valid encounter within last 12 months  Recent Outpatient Visits           1 week ago Welcome to [employer] preventive visit   [facility] [place], Marzella Schlein, MD   1 year ago Annual physical exam   [facility] [place], Marzella Schlein, MD   2 years ago Annual physical exam   [facility] Margaretann Loveless, [place]   3 years ago Encounter for annual health examination   [facility] [place], Alessandra Bevels, [place]   5 years ago Annual physical exam   [facility] [place], Alessandra Bevels, [place]       Future Appointments             In 11 months Bacigalupo, [facility]             NIFEdipine (PROCARDIA-XL/NIFEDICAL-XL) 30 MG 24 hr tablet [Pharmacy Med Name: NIFEDIPINE ER OSMOTIC RELEASE 30 MG] 30 tablet 3    Sig: TAKE 1 TABLET BY MOUTH ONCE DAILY     Cardiovascular: Calcium Channel Blockers 2 Passed - 03/12/2023  2:15 PM      Passed - Last BP in normal range    BP Readings from Last 1  Encounters:  03/02/23 139/73         Passed - Last Heart Rate in normal range    Pulse Readings from Last 1 Encounters:  03/02/23 61         Passed - Valid encounter within last 6 months    Recent Outpatient Visits           1 week ago Welcome to [employer] preventive visit   [facility] [place], Marzella Schlein, MD   1 year ago Annual physical exam   [facility] [place], Marzella Schlein, MD   2 years ago Annual physical exam   [facility] [place], Alessandra Bevels, [place]   3 years ago Encounter for annual health examination   [facility] [place], Alessandra Bevels, [place]   5 years ago Annual physical exam   [facility] [place], Alessandra Bevels, [place]       Future Appointments             In 11 months Bacigalupo, Marzella Schlein, MD [facility], [facility]

## 2023-03-28 DIAGNOSIS — H524 Presbyopia: Secondary | ICD-10-CM | POA: Diagnosis not present

## 2023-04-01 DIAGNOSIS — Z0100 Encounter for examination of eyes and vision without abnormal findings: Secondary | ICD-10-CM | POA: Diagnosis not present

## 2023-04-09 ENCOUNTER — Ambulatory Visit
Admission: RE | Admit: 2023-04-09 | Discharge: 2023-04-09 | Disposition: A | Discharge status: Home / Self Care | Payer: Medicare HMO | Source: Ambulatory Visit | Attending: Family Medicine | Admitting: Family Medicine

## 2023-04-09 DIAGNOSIS — Z1231 Encounter for screening mammogram for malignant neoplasm of breast: Secondary | ICD-10-CM | POA: Diagnosis not present

## 2023-05-24 ENCOUNTER — Ambulatory Visit
Admission: RE | Admit: 2023-05-24 | Discharge: 2023-05-24 | Disposition: A | Discharge status: Home / Self Care | Payer: Medicare HMO | Source: Ambulatory Visit | Attending: Family Medicine | Admitting: Family Medicine

## 2023-05-24 DIAGNOSIS — Z780 Asymptomatic menopausal state: Secondary | ICD-10-CM | POA: Diagnosis not present

## 2023-06-09 ENCOUNTER — Other Ambulatory Visit: Payer: Self-pay | Admitting: Family Medicine

## 2023-06-09 DIAGNOSIS — E7800 Pure hypercholesterolemia, unspecified: Secondary | ICD-10-CM

## 2023-06-09 DIAGNOSIS — F419 Anxiety disorder, unspecified: Secondary | ICD-10-CM

## 2023-06-09 DIAGNOSIS — I7300 Raynaud's syndrome without gangrene: Secondary | ICD-10-CM

## 2023-06-09 DIAGNOSIS — F1010 Alcohol abuse, uncomplicated: Secondary | ICD-10-CM

## 2023-06-09 DIAGNOSIS — K2100 Gastro-esophageal reflux disease with esophagitis, without bleeding: Secondary | ICD-10-CM

## 2023-06-09 NOTE — Telephone Encounter (Signed)
 Copied from CRM [id number]. Topic: Clinical - Medication Refill >> Jun 09, 2023 10:18 AM Nada Libman H wrote: Most Recent Primary Care Visit:  Provider: Erasmo Downer  Department: [id number]  Visit Type: PHYSICAL  Date: 03/02/2023  Medication:  atorvastatin (LIPITOR) 10 MG tablet [[id number]] escitalopram (LEXAPRO) 10 MG tablet [[id number]] pantoprazole (PROTONIX) 40 MG tablet [[id number]] NIFEdipine (PROCARDIA-XL/NIFEDICAL-XL) 30 MG 24 hr tablet [[id number]] naltrexone (DEPADE) 50 MG tablet [[id number]]   Has the patient contacted their pharmacy? No (Agent: If no, request that the patient contact the pharmacy for the refill. If patient does not wish to contact the pharmacy document the reason why and proceed with request.) (Agent: If yes, when and what did the pharmacy advise?)  Is this the correct pharmacy for this prescription? Yes If no, delete pharmacy and type the correct one.  This is the patient's preferred pharmacy:  [facility] [address], [place] - [address] [place] [address] Phone: [phone] Fax: [phone]   Has the prescription been filled recently? No  Is the patient out of the medication? Yes  Has the patient been seen for an appointment in the last year OR does the patient have an upcoming appointment? Yes  Can we respond through MyChart? No  Agent: Please be advised that Rx refills may take up to 3 business days. We ask that you follow-up with your pharmacy.

## 2023-06-14 ENCOUNTER — Ambulatory Visit: Payer: Self-pay | Admitting: Family Medicine

## 2023-06-14 NOTE — Telephone Encounter (Signed)
 Red Word that prompted transfer to Nurse Triage: Right foot pain, sometimes worsening.    Chief Complaint: Right foot pain Symptoms: Above Frequency: December Pertinent Negatives: Patient denies any injury Disposition: [] ED /[] Urgent Care (no appt availability in office) / [x] Appointment(In office/virtual)/ []  [facility]/ [] Home Care/ [] Refused Recommended Disposition /[] [place] [facility]/ []  Follow-up with PCP Additional Notes: Agrees with appointment.  Reason for Disposition  [1] MODERATE pain (e.g., interferes with normal activities, limping) AND [2] present > 3 days  Answer Assessment - Initial Assessment Questions 1. ONSET: "When did the pain start?"      December 2. LOCATION: "Where is the pain located?"      Right 3. PAIN: "How bad is the pain?"    (Scale 1-10; or mild, moderate, severe)  - MILD (1-3): doesn't interfere with normal activities.   - MODERATE (4-7): interferes with normal activities (e.g., work or school) or awakens from sleep, limping.   - SEVERE (8-10): excruciating pain, unable to do any normal activities, unable to walk.      Now - 3   can hurt worse with walking 4. WORK OR EXERCISE: "Has there been any recent work or exercise that involved this part of the body?"      No 5. CAUSE: "What do you think is causing the foot pain?"     Unsure 6. OTHER SYMPTOMS: "Do you have any other symptoms?" (e.g., leg pain, rash, fever, numbness)     No 7. PREGNANCY: "Is there any chance you are pregnant?" "When was your last menstrual period?"     No  Protocols used: Foot Pain-A-AH

## 2023-06-15 ENCOUNTER — Encounter: Payer: Self-pay | Admitting: Family Medicine

## 2023-06-15 ENCOUNTER — Ambulatory Visit (INDEPENDENT_AMBULATORY_CARE_PROVIDER_SITE_OTHER): Admitting: Family Medicine

## 2023-06-15 VITALS — BP 146/78 | HR 60 | Temp 98.1°F | Resp 16 | Ht 59.0 in | Wt 148.1 lb

## 2023-06-15 DIAGNOSIS — Z6829 Body mass index (BMI) 29.0-29.9, adult: Secondary | ICD-10-CM | POA: Diagnosis not present

## 2023-06-15 DIAGNOSIS — E663 Overweight: Secondary | ICD-10-CM | POA: Diagnosis not present

## 2023-06-15 DIAGNOSIS — M722 Plantar fascial fibromatosis: Secondary | ICD-10-CM

## 2023-06-15 DIAGNOSIS — B070 Plantar wart: Secondary | ICD-10-CM | POA: Diagnosis not present

## 2023-06-15 MED ORDER — NALTREXONE HCL 50 MG PO TABS: 100.0000 mg | ORAL_TABLET | Freq: Every day | ORAL | 3 refills | Status: DC

## 2023-06-15 MED ORDER — ATORVASTATIN CALCIUM 10 MG PO TABS
10.0000 mg | ORAL_TABLET | Freq: Every day | ORAL | 11 refills | Status: AC
Start: 2023-06-15 — End: ?

## 2023-06-15 MED ORDER — NIFEDIPINE ER OSMOTIC RELEASE 30 MG PO TB24: 30.0000 mg | ORAL_TABLET | Freq: Every day | ORAL | 5 refills | Status: AC

## 2023-06-15 MED ORDER — ESCITALOPRAM OXALATE 10 MG PO TABS: 10.0000 mg | ORAL_TABLET | Freq: Every day | ORAL | 5 refills | Status: AC

## 2023-06-15 MED ORDER — BUPROPION HCL ER (SR) 150 MG PO TB12
ORAL_TABLET | ORAL | 1 refills | Status: DC
Start: 2023-06-15 — End: 2023-07-08

## 2023-06-15 MED ORDER — PANTOPRAZOLE SODIUM 40 MG PO TBEC: 40.0000 mg | DELAYED_RELEASE_TABLET | Freq: Every day | ORAL | 11 refills | Status: AC

## 2023-06-15 MED ORDER — NAPROXEN 500 MG PO TABS: 500.0000 mg | ORAL_TABLET | Freq: Two times a day (BID) | ORAL | 0 refills | Status: DC

## 2023-06-15 NOTE — Progress Notes (Signed)
 Established patient visit   Patient: Dawn Adkins   DOB: 1956/02/19   68 y.o. Female  MRN: [id number] Visit Date: 06/15/2023  Today's healthcare provider: Sherlyn Hay, DO   Chief Complaint  Patient presents with   Foot Injury    Rt foot pain since Dec.. Feels like some relief but having pain.+ Nees refills on all meds except sleep meds   Subjective    HPI Dawn Adkins is a 68 year old female who presents with right foot pain.  She has been experiencing right foot pain since December, which worsens with prolonged standing or walking. The pain is described as swelling and discomfort, particularly when she is on her feet all day, making it difficult to walk. The pain is persistent and varies in intensity, with occasional tingling and a sensation of heat in the bottom of her foot. Initially, the pain started in one area but has since spread, sometimes affecting the back of the foot. The most consistent area of pain is the heel, particularly at the bottom and towards the back. No history of similar foot pain is noted.  She has not used any medications for the foot pain yet but is able to take ibuprofen and Tylenol without issues. She is currently taking nifedipine for Raynaud's phenomenon and naltrexone which she uses sparingly.  Her daily activities include cleaning her house and taking care of her handicapped son, which involves taking him to town and other activities. She is retired and takes care of her handicapped son. Her husband is also retired. She has signed up for a [employer] program but has not been able to attend due to family responsibilities.  No numbness is reported, but there is occasional tingling and a sensation of heat in the foot. The pain is worse with prolonged standing or walking.     Medications: Outpatient Medications Prior to Visit  Medication Sig   Ergocalciferol (VITAMIN D2) 2000 UNITS TABS Take 1 tablet by mouth daily.   traZODone (DESYREL) 50  MG tablet TAKE 1/2 TO 1 TABLET BY MOUTH ONCE DAILY   [DISCONTINUED] atorvastatin (LIPITOR) 10 MG tablet TAKE 1 TABLET BY MOUTH ONCE DAILY   [DISCONTINUED] escitalopram (LEXAPRO) 10 MG tablet TAKE 1 TABLET BY MOUTH ONCE DAILY   [DISCONTINUED] naltrexone (DEPADE) 50 MG tablet TAKE 2 TABLETS BY MOUTH ONCE DAILY   [DISCONTINUED] NIFEdipine (PROCARDIA-XL/NIFEDICAL-XL) 30 MG 24 hr tablet TAKE 1 TABLET BY MOUTH ONCE DAILY   [DISCONTINUED] pantoprazole (PROTONIX) 40 MG tablet TAKE 1 TABLET BY MOUTH ONCE DAILY   No facility-administered medications prior to visit.        Objective    BP (!) 146/78 (BP Location: Right Arm, Patient Position: Sitting, Cuff Size: Large)   Pulse 60   Temp 98.1 F (36.7 C) (Oral)   Resp 16   Ht 4\' 11"  (1.499 m)   Wt 148 lb 1.6 oz (67.2 kg)   SpO2 100%   BMI 29.91 kg/m     Physical Exam Vitals and nursing note reviewed.  Constitutional:      General: She is not in acute distress.    Appearance: Normal appearance.  HENT:     Head: Normocephalic and atraumatic.  Eyes:     General: No scleral icterus.    Conjunctiva/sclera: Conjunctivae normal.  Cardiovascular:     Rate and Rhythm: Normal rate.  Pulmonary:     Effort: Pulmonary effort is normal.  Musculoskeletal:       Feet:  Neurological:     Mental Status: She is alert and oriented to person, place, and time. Mental status is at baseline.  Psychiatric:        Mood and Affect: Mood normal.        Behavior: Behavior normal.      No results found for any visits on 06/15/23.  Assessment & Plan    Plantar fasciitis of right foot -     Naproxen; Take 1 tablet (500 mg total) by mouth 2 (two) times daily with a meal.  Dispense: 8 tablet; Refill: 0  Plantar wart of right foot  Overweight with body mass index (BMI) of 29 to 29.9 in adult -     buPROPion HCl ER (SR); Initial, 150 mg orally once daily for 3 days, then increase to 300 mg/day  Dispense: 60 tablet; Refill: 1   Plantar  Fasciitis Chronic right foot pain since December, exacerbated by prolonged standing and walking, primarily in the heel and bottom of the foot. Symptoms and location are consistent with plantar fasciitis, with differential diagnosis including arthritis. No significant swelling observed. - Provide exercises for plantar fasciitis to be done twice daily. - Advise rest and use of a frozen water bottle to roll under the foot for targeted anti-inflammatory effect. - Recommend high-dose naproxen for 4 days to target inflammation and pain, contingent on kidney function. - Advise wearing supportive shoes and consider weight loss to reduce pressure on the foot.  Wart on Right Foot Presence of a wart on the right foot with black spots indicating blood vessels, causing occasional pain. - Recommend use of Compound W for wart treatment. - Advise covering the wart with liquid bandage or duct tape after applying Compound W to reduce air exposure.  Weight Management Difficulty with weight loss, currently taking naltrexone. Emotional eating when bored. Signed up for [employer] program but has not attended. - Prescribe bupropion in combination with naltrexone to aid in weight loss. - Encourage attendance at [employer] program for physical activity. - Advise increasing vegetable intake and consider growing a garden for fresh produce.  Follow-up Follow-up appointment scheduled for next year, advised to follow up in a couple of months to assess progress on bupropion and weight management. - Schedule follow-up appointment in a couple of months to assess progress on bupropion and weight management. - Advise calling if doing well on bupropion and wishes to continue.   Return in about 3 months (around 09/15/2023) for Weight w/PCP.      I discussed the assessment and treatment plan with the patient  The patient was provided an opportunity to ask questions and all were answered. The patient agreed with the  plan and demonstrated an understanding of the instructions.   The patient was advised to call back or seek an in-person evaluation if the symptoms worsen or if the condition fails to improve as anticipated.    Sherlyn Hay, DO  [facility] [phone] (phone) [phone] (fax)  [facility]

## 2023-07-07 ENCOUNTER — Other Ambulatory Visit: Payer: Self-pay | Admitting: Family Medicine

## 2023-07-07 DIAGNOSIS — E663 Overweight: Secondary | ICD-10-CM

## 2023-07-08 NOTE — Telephone Encounter (Signed)
 Requested Prescriptions  Pending Prescriptions Disp Refills   buPROPion (WELLBUTRIN SR) 150 MG 12 hr tablet [Pharmacy Med Name: BUPROPION HCL SR 150 MG TABLET] 180 tablet 2    Sig: TAKE 1 TABLET BY MOUTH DAILY FOR 3 DAYS THEN INCREASE TO 2 TABLETS DAILY     Psychiatry: Antidepressants - bupropion Failed - 07/08/2023  8:56 AM      Failed - Last BP in normal range    BP Readings from Last 1 Encounters:  06/15/23 (!) 146/78         Failed - Valid encounter within last 6 months    Recent Outpatient Visits           3 weeks ago Plantar fasciitis of right foot   [facility] [place], Monico Blitz, DO       Future Appointments             In 2 months Willeen Niece, MD [facility]   In 7 months Bacigalupo, Marzella Schlein, MD [facility]            Passed - Cr in normal range and within 360 days    Creatinine, Ser  Date Value Ref Range Status  03/02/2023 0.74 0.57 - 1.00 mg/dL Final         Passed - AST in normal range and within 360 days    AST  Date Value Ref Range Status  03/02/2023 20 0 - 40 IU/L Final         Passed - ALT in normal range and within 360 days    ALT  Date Value Ref Range Status  03/02/2023 18 0 - 32 IU/L Final

## 2023-09-27 ENCOUNTER — Encounter: Payer: Self-pay | Admitting: Dermatology

## 2023-09-27 ENCOUNTER — Ambulatory Visit: Payer: Medicare HMO | Admitting: Dermatology

## 2023-09-27 DIAGNOSIS — D2272 Melanocytic nevi of left lower limb, including hip: Secondary | ICD-10-CM | POA: Diagnosis not present

## 2023-09-27 DIAGNOSIS — L578 Other skin changes due to chronic exposure to nonionizing radiation: Secondary | ICD-10-CM | POA: Diagnosis not present

## 2023-09-27 DIAGNOSIS — W908XXA Exposure to other nonionizing radiation, initial encounter: Secondary | ICD-10-CM

## 2023-09-27 DIAGNOSIS — L821 Other seborrheic keratosis: Secondary | ICD-10-CM

## 2023-09-27 DIAGNOSIS — L299 Pruritus, unspecified: Secondary | ICD-10-CM

## 2023-09-27 DIAGNOSIS — L814 Other melanin hyperpigmentation: Secondary | ICD-10-CM

## 2023-09-27 DIAGNOSIS — Z1283 Encounter for screening for malignant neoplasm of skin: Secondary | ICD-10-CM | POA: Diagnosis not present

## 2023-09-27 DIAGNOSIS — L820 Inflamed seborrheic keratosis: Secondary | ICD-10-CM | POA: Diagnosis not present

## 2023-09-27 DIAGNOSIS — D2262 Melanocytic nevi of left upper limb, including shoulder: Secondary | ICD-10-CM

## 2023-09-27 DIAGNOSIS — D1801 Hemangioma of skin and subcutaneous tissue: Secondary | ICD-10-CM

## 2023-09-27 DIAGNOSIS — L853 Xerosis cutis: Secondary | ICD-10-CM

## 2023-09-27 DIAGNOSIS — Z872 Personal history of diseases of the skin and subcutaneous tissue: Secondary | ICD-10-CM

## 2023-09-27 DIAGNOSIS — S90562A Insect bite (nonvenomous), left ankle, initial encounter: Secondary | ICD-10-CM

## 2023-09-27 DIAGNOSIS — D229 Melanocytic nevi, unspecified: Secondary | ICD-10-CM | POA: Diagnosis not present

## 2023-09-27 DIAGNOSIS — I781 Nevus, non-neoplastic: Secondary | ICD-10-CM

## 2023-09-27 MED ORDER — MOMETASONE FUROATE 0.1 % EX CREA: TOPICAL_CREAM | CUTANEOUS | 0 refills | Status: AC

## 2023-09-27 NOTE — Patient Instructions (Addendum)
 Cryotherapy Aftercare  Wash gently with soap and water everyday.   Apply Vaseline and Band-Aid daily until healed.   Seborrheic Keratosis  What causes seborrheic keratoses? Seborrheic keratoses are harmless, common skin growths that first appear during adult life.  As time goes by, more growths appear.  Some people may develop a large number of them.  Seborrheic keratoses appear on both covered and uncovered body parts.  They are not caused by sunlight.  The tendency to develop seborrheic keratoses can be inherited.  They vary in color from skin-colored to gray, brown, or even black.  They can be either smooth or have a rough, warty surface.   Seborrheic keratoses are superficial and look as if they were stuck on the skin.  Under the microscope this type of keratosis looks like layers upon layers of skin.  That is why at times the top layer may seem to fall off, but the rest of the growth remains and re-grows.    Treatment Seborrheic keratoses do not need to be treated, but can easily be removed in the office.  Seborrheic keratoses often cause symptoms when they rub on clothing or jewelry.  Lesions can be in the way of shaving.  If they become inflamed, they can cause itching, soreness, or burning.  Removal of a seborrheic keratosis can be accomplished by freezing, burning, or surgery. If any spot bleeds, scabs, or grows rapidly, please return to have it checked, as these can be an indication of a skin cancer.   Gentle Skin Care Guide  1. Bathe no more than once a day.  2. Avoid bathing in hot water  3. Use a mild soap like Dove, Vanicream, Cetaphil, CeraVe. Can use Lever 2000 or Cetaphil antibacterial soap  4. Use soap only where you need it. On most days, use it under your arms, between your legs, and on your feet. Let the water rinse other areas unless visibly dirty.  5. When you get out of the bath/shower, use a towel to gently blot your skin dry, don't rub it.  6. While your skin  is still a little damp, apply a moisturizing cream such as Vanicream, CeraVe, Cetaphil, Eucerin, Sarna lotion or plain Vaseline Jelly. For hands apply Neutrogena [place] Hand Cream or Excipial Hand Cream.  7. Reapply moisturizer any time you start to itch or feel dry.  8. Sometimes using free and clear laundry detergents can be helpful. Fabric softener sheets should be avoided. Downy Free & Gentle liquid, or any liquid fabric softener that is free of dyes and perfumes, it acceptable to use  9. If your doctor has given you prescription creams you may apply moisturizers over them      Melanoma ABCDEs  Melanoma is the most dangerous type of skin cancer, and is the leading cause of death from skin disease.  You are more likely to develop melanoma if you: Have light-colored skin, light-colored eyes, or red or blond hair Spend a lot of time in the sun Tan regularly, either outdoors or in a tanning bed Have had blistering sunburns, especially during childhood Have a close family member who has had a melanoma Have atypical moles or large birthmarks  Early detection of melanoma is key since treatment is typically straightforward and cure rates are extremely high if we catch it early.   The first sign of melanoma is often a change in a mole or a new dark spot.  The ABCDE system is a way of remembering the signs of  melanoma.  A for asymmetry:  The two halves do not match. B for border:  The edges of the growth are irregular. C for color:  A mixture of colors are present instead of an even brown color. D for diameter:  Melanomas are usually (but not always) greater than 6mm - the size of a pencil eraser. E for evolution:  The spot keeps changing in size, shape, and color.  Please check your skin once per month between visits. You can use a small mirror in front and a large mirror behind you to keep an eye on the back side or your body.   If you see any new or changing lesions before your next  follow-up, please call to schedule a visit.  Please continue daily skin protection including broad spectrum sunscreen SPF 30+ to sun-exposed areas, reapplying every 2 hours as needed when you're outdoors.   Staying in the shade or wearing long sleeves, sun glasses (UVA+UVB protection) and wide brim hats (4-inch brim around the entire circumference of the hat) are also recommended for sun protection.    Due to recent changes in healthcare laws, you may see results of your pathology and/or laboratory studies on MyChart before the doctors have had a chance to review them. We understand that in some cases there may be results that are confusing or concerning to you. Please understand that not all results are received at the same time and often the doctors may need to interpret multiple results in order to provide you with the best plan of care or course of treatment. Therefore, we ask that you please give [place] 2 business days to thoroughly review all your results before contacting the office for clarification. Should we see a critical lab result, you will be contacted sooner.   If You Need Anything After Your Visit  If you have any questions or concerns for your doctor, please call our main line at [phone] and press option 4 to reach your doctor's medical assistant. If no one answers, please leave a voicemail as directed and we will return your call as soon as possible. Messages left after 4 pm will be answered the following business day.   You may also send [place] a message via MyChart. We typically respond to MyChart messages within 1-2 business days.  For prescription refills, please ask your pharmacy to contact our office. Our fax number is [phone].  If you have an urgent issue when the clinic is closed that cannot wait until the next business day, you can page your doctor at the number below.    Please note that while we do our best to be available for urgent issues outside of office hours,  we are not available 24/7.   If you have an urgent issue and are unable to reach [place], you may choose to seek medical care at your doctor's office, retail clinic, urgent care center, or emergency room.  If you have a medical emergency, please immediately call 911 or go to the emergency department.  Pager Numbers  - Dr. Gwen Pounds: [phone]  - Dr. Roseanne Reno: [phone]  - Dr. Katrinka Blazing: [phone]   In the event of inclement weather, please call our main line at [phone] for an update on the status of any delays or closures.  Dermatology Medication Tips: Please keep the boxes that topical medications come in in order to help keep track of the instructions about where and how to use these. Pharmacies typically print the medication instructions only on  the boxes and not directly on the medication tubes.   If your medication is too expensive, please contact our office at [phone] option 4 or send [place] a message through MyChart.   We are unable to tell what your co-pay for medications will be in advance as this is different depending on your insurance coverage. However, we may be able to find a substitute medication at lower cost or fill out paperwork to get insurance to cover a needed medication.   If a prior authorization is required to get your medication covered by your insurance company, please allow [place] 1-2 business days to complete this process.  Drug prices often vary depending on where the prescription is filled and some pharmacies may offer cheaper prices.  The website www.goodrx.com contains coupons for medications through different pharmacies. The prices here do not account for what the cost may be with help from insurance (it may be cheaper with your insurance), but the website can give you the price if you did not use any insurance.  - You can print the associated coupon and take it with your prescription to the pharmacy.  - You may also stop by our office during regular  business hours and pick up a GoodRx coupon card.  - If you need your prescription sent electronically to a different pharmacy, notify our office through [facility] or by phone at [phone] option 4.     Si Usted Necesita Algo Despus de Su Visita  Tambin puede enviarnos un mensaje a travs de Clinical cytogeneticist. Por lo general respondemos a los mensajes de MyChart en el transcurso de 1 a 2 das hbiles.  Para renovar recetas, por favor pida a su farmacia que se ponga en contacto con nuestra oficina. Annie Sable de fax es [place] [phone].  Si tiene un asunto urgente cuando la clnica est cerrada y que no puede esperar hasta el siguiente da hbil, puede llamar/localizar a su doctor(a) al nmero que aparece a continuacin.   Por favor, tenga en cuenta que aunque hacemos todo lo posible para estar disponibles para asuntos urgentes fuera del horario de [place], no estamos disponibles las 24 horas del da, los 7 [address]  [address] de la [place].   Si tiene un problema urgente y no puede comunicarse con nosotros, puede optar por buscar atencin mdica  en el consultorio de su doctor(a), en una clnica privada, en un centro de atencin urgente o en una sala de emergencias.  Si tiene Engineer, drilling, por favor llame inmediatamente al 911 o vaya a la sala de emergencias.  Nmeros de bper  - Dr. Gwen Pounds: [phone]  - Dra. Roseanne Reno: [phone]  - Dr. Katrinka Blazing: [phone]   En caso de inclemencias del tiempo, por favor llame a Lacy Duverney principal al [phone] para una actualizacin sobre el [place] de cualquier retraso o cierre.  Consejos para la medicacin en dermatologa: Por favor, guarde las cajas en las que vienen los medicamentos de uso tpico para ayudarle a seguir las instrucciones sobre dnde y cmo usarlos. Las farmacias generalmente imprimen las instrucciones del medicamento slo en las cajas y no directamente en los tubos del [place].   Si su medicamento es muy caro, por  favor, pngase en contacto con Rolm Gala llamando al [phone] y presione la opcin 4 o envenos un mensaje a travs de Clinical cytogeneticist.   No podemos decirle cul ser su copago por los medicamentos por adelantado ya que esto es diferente dependiendo de la cobertura de su seguro. Sin embargo, es posible  que podamos encontrar un medicamento sustituto a Audiological scientist un formulario para que el seguro cubra el medicamento que se considera necesario.   Si se requiere una autorizacin previa para que su compaa de seguros [place] su medicamento, por favor permtanos de 1 a 2 das hbiles para completar [address]  Los precios de los medicamentos varan con frecuencia dependiendo del Environmental consultant de dnde se surte la receta y alguna farmacias pueden ofrecer precios ms baratos.  El sitio web www.goodrx.com tiene cupones para medicamentos de Health and safety inspector. Los precios aqu no tienen en cuenta lo que podra costar con la ayuda del seguro (puede ser ms barato con su seguro), pero el sitio web puede darle el precio si no utiliz Tourist information centre manager.  - Puede imprimir el cupn correspondiente y llevarlo con su receta a la farmacia.  - Tambin puede pasar por nuestra oficina durante el horario de atencin regular y Education officer, museum una tarjeta de cupones de GoodRx.  - Si necesita que su receta se enve electrnicamente a una farmacia diferente, informe a nuestra oficina a travs de MyChart de [place] o por telfono llamando al [phone] y presione la opcin 4.

## 2023-09-27 NOTE — Progress Notes (Signed)
 Follow-Up Visit   Subjective  Dawn Adkins is a 68 y.o. female who presents for the following: Skin Cancer Screening and Full Body Skin Exam  The patient presents for Total-Body Skin Exam (TBSE) for skin cancer screening and mole check. The patient has spots, moles and lesions to be evaluated, some may be new or changing. She has itchy spots on her lower neck. History of [place]. No history of skin cancer.     The following portions of the chart were reviewed this encounter and updated as appropriate: medications, allergies, medical history  Review of Systems:  No other skin or systemic complaints except as noted in HPI or Assessment and Plan.  Objective  Well appearing patient in no apparent distress; mood and affect are within normal limits.  A full examination was performed including scalp, head, eyes, ears, nose, lips, neck, chest, axillae, abdomen, back, buttocks, bilateral upper extremities, bilateral lower extremities, hands, feet, fingers, toes, fingernails, and toenails. All findings within normal limits unless otherwise noted below.   Relevant physical exam findings are noted in the Assessment and Plan.  L lower neck x 10, R lower neck x 8 (18) Multiple tiny stuck-on, waxy tan papules  Assessment & Plan   SKIN CANCER SCREENING PERFORMED TODAY.  ACTINIC DAMAGE - Chronic condition, secondary to cumulative UV/sun exposure - diffuse scaly erythematous macules with underlying dyspigmentation - Recommend daily broad spectrum sunscreen SPF 30+ to sun-exposed areas, reapply every 2 hours as needed.  - Staying in the shade or wearing long sleeves, sun glasses (UVA+UVB protection) and wide brim hats (4-inch brim around the entire circumference of the hat) are also recommended for sun protection.  - Call for new or changing lesions.  LENTIGINES, SEBORRHEIC KERATOSES, HEMANGIOMAS - Benign normal skin lesions - Benign-appearing - Call for any changes  MELANOCYTIC NEVI -  Tan-brown and/or pink-flesh-colored symmetric macules and papules - left posterior medial thigh 3 x 2 mm two toned brown macule  - left posterior shoulder 9 mm flesh papule - left medial knee 3 mm med brown thin papule - Benign appearing on exam today - Observation - Call clinic for new or changing moles - Recommend daily use of broad spectrum spf 30+ sunscreen to sun-exposed areas.   Xerosis with Pruritus - diffuse xerotic patches - recommend gentle, hydrating skin care - gentle skin care handout given  Spider Veins - Dilated blue, purple or red veins at the lower extremities - Reassured - Smaller vessels can be treated by sclerotherapy (a procedure to inject a medicine into the veins to make them disappear) if desired, but the treatment is not covered by insurance. Larger vessels may be covered if symptomatic and we would refer to vascular surgeon if treatment desired.  BITE REACTION Exam: Pink edematous papule at left ankle  Start mometasone cream to affected areas bites twice daily until improved dsp 45g 0Rf. Avoid applying to face, groin, and axilla. Use as directed. Long-term use can cause thinning of the skin.  HISTORY OF PRECANCEROUS ACTINIC KERATOSIS - site(s) of PreCancerous Actinic Keratosis clear today at nose - these may recur and new lesions may form requiring treatment to prevent transformation into skin cancer - observe for new or changing spots and contact [facility] for appointment if occur - photoprotection with sun protective clothing; sunglasses and broad spectrum sunscreen with SPF of at least 30 + and frequent self skin exams recommended - yearly exams by a dermatologist recommended for persons with history of PreCancerous Actinic  Keratoses   INFLAMED SEBORRHEIC KERATOSIS (18) L lower neck x 10, R lower neck x 8 (18) Symptomatic, irritating, patient would like treated. Destruction of lesion - L lower neck x 10, R lower neck x 8 (18)  Destruction  method: cryotherapy   Informed consent: discussed and consent obtained   Lesion destroyed using liquid nitrogen: Yes   Region frozen until ice ball extended beyond lesion: Yes   Outcome: patient tolerated procedure well with no complications   Post-procedure details: wound care instructions given   Additional details:  Prior to procedure, discussed risks of blister formation, small wound, skin dyspigmentation, or rare scar following cryotherapy. Recommend Vaseline ointment to treated areas while healing.  Return in about 1 year (around 09/26/2024) for TBSE.  IAndrea Kerns, CMA, am acting as scribe for Rexene Rattler, MD .   Documentation: I have reviewed the above documentation for accuracy and completeness, and I agree with the above.  Rexene Rattler, MD

## 2024-03-02 ENCOUNTER — Encounter: Payer: Self-pay | Admitting: Family Medicine

## 2024-03-06 ENCOUNTER — Other Ambulatory Visit: Payer: Self-pay | Admitting: Family Medicine

## 2024-03-06 DIAGNOSIS — Z1231 Encounter for screening mammogram for malignant neoplasm of breast: Secondary | ICD-10-CM

## 2024-03-07 ENCOUNTER — Encounter: Admitting: Family Medicine

## 2024-03-30 ENCOUNTER — Other Ambulatory Visit: Payer: Self-pay | Admitting: Family Medicine

## 2024-03-30 DIAGNOSIS — E663 Overweight: Secondary | ICD-10-CM

## 2024-04-10 ENCOUNTER — Ambulatory Visit
Admission: RE | Admit: 2024-04-10 | Discharge: 2024-04-10 | Disposition: A | Discharge status: Home / Self Care | Source: Ambulatory Visit | Attending: Family Medicine | Admitting: Family Medicine

## 2024-04-10 DIAGNOSIS — Z1231 Encounter for screening mammogram for malignant neoplasm of breast: Secondary | ICD-10-CM | POA: Diagnosis present

## 2024-04-12 ENCOUNTER — Ambulatory Visit: Payer: Self-pay | Admitting: Family Medicine

## 2024-04-25 ENCOUNTER — Encounter: Admitting: Family Medicine

## 2024-05-02 ENCOUNTER — Ambulatory Visit
Admission: RE | Admit: 2024-05-02 | Discharge: 2024-05-02 | Disposition: A | Source: Ambulatory Visit | Attending: Family Medicine

## 2024-05-02 ENCOUNTER — Ambulatory Visit: Admitting: Family Medicine

## 2024-05-02 ENCOUNTER — Encounter: Payer: Self-pay | Admitting: Family Medicine

## 2024-05-02 ENCOUNTER — Ambulatory Visit
Admission: RE | Admit: 2024-05-02 | Discharge: 2024-05-02 | Disposition: A | Source: Home / Self Care | Attending: Family Medicine | Admitting: Family Medicine

## 2024-05-02 VITALS — BP 136/80 | HR 56 | Resp 14 | Ht 59.0 in | Wt 145.2 lb

## 2024-05-02 DIAGNOSIS — Z6829 Body mass index (BMI) 29.0-29.9, adult: Secondary | ICD-10-CM | POA: Diagnosis not present

## 2024-05-02 DIAGNOSIS — F419 Anxiety disorder, unspecified: Secondary | ICD-10-CM | POA: Diagnosis not present

## 2024-05-02 DIAGNOSIS — E7800 Pure hypercholesterolemia, unspecified: Secondary | ICD-10-CM | POA: Diagnosis not present

## 2024-05-02 DIAGNOSIS — R7303 Prediabetes: Secondary | ICD-10-CM | POA: Insufficient documentation

## 2024-05-02 DIAGNOSIS — M25551 Pain in right hip: Secondary | ICD-10-CM | POA: Diagnosis not present

## 2024-05-02 DIAGNOSIS — E559 Vitamin D deficiency, unspecified: Secondary | ICD-10-CM

## 2024-05-02 DIAGNOSIS — F1010 Alcohol abuse, uncomplicated: Secondary | ICD-10-CM | POA: Diagnosis not present

## 2024-05-02 DIAGNOSIS — Z0001 Encounter for general adult medical examination with abnormal findings: Secondary | ICD-10-CM | POA: Diagnosis not present

## 2024-05-02 DIAGNOSIS — I7300 Raynaud's syndrome without gangrene: Secondary | ICD-10-CM

## 2024-05-02 DIAGNOSIS — K5904 Chronic idiopathic constipation: Secondary | ICD-10-CM | POA: Insufficient documentation

## 2024-05-02 DIAGNOSIS — F5101 Primary insomnia: Secondary | ICD-10-CM

## 2024-05-02 DIAGNOSIS — E663 Overweight: Secondary | ICD-10-CM | POA: Diagnosis not present

## 2024-05-02 DIAGNOSIS — Z0000 Encounter for general adult medical examination without abnormal findings: Secondary | ICD-10-CM

## 2024-05-02 MED ORDER — NALTREXONE HCL 50 MG PO TABS: 50.0000 mg | ORAL_TABLET | Freq: Every day | ORAL | 3 refills | Status: AC

## 2024-05-02 MED ORDER — TRAZODONE HCL 50 MG PO TABS: ORAL_TABLET | ORAL | 3 refills | Status: AC

## 2024-05-02 MED ORDER — BUPROPION HCL ER (SR) 150 MG PO TB12: ORAL_TABLET | ORAL | 2 refills | Status: AC

## 2024-05-02 NOTE — Assessment & Plan Note (Addendum)
 Reports gaining 40 lbs over a few months. Disucssed options at previous visit but was unable to start medication at that time. Will proceed with oral medication management. - Start wellbutrin  150mg  twice daily - Start naltrexone  50mg  once daily

## 2024-05-02 NOTE — Assessment & Plan Note (Signed)
 Chronic and stable  Continue trazodone

## 2024-05-02 NOTE — Assessment & Plan Note (Signed)
 Chronic and well controlled Continue nifedepine at current dose

## 2024-05-02 NOTE — Assessment & Plan Note (Signed)
 Last A1c of 5.7, will continue to monitor and recheck to assess further management.

## 2024-05-02 NOTE — Assessment & Plan Note (Signed)
 Reports having pain in right hip that radiates into groin area with intermittent sharp shooting pains. Will order imaging for further evaluation.

## 2024-05-02 NOTE — Assessment & Plan Note (Signed)
 No longer taking a supplement Recheck levels and consider resuming supplement

## 2024-05-02 NOTE — Assessment & Plan Note (Signed)
 Previously well controlled Continue statin Repeat FLP and CMP

## 2024-05-02 NOTE — Assessment & Plan Note (Signed)
Chronic and well controlled Continue lexapro at current dose 

## 2024-05-03 LAB — COMPREHENSIVE METABOLIC PANEL WITH GFR
ALT: 20 [IU]/L (ref 0–32)
AST: 21 [IU]/L (ref 0–40)
Albumin: 5 g/dL — ABNORMAL HIGH (ref 3.9–4.9)
Alkaline Phosphatase: 94 [IU]/L (ref 49–135)
BUN/Creatinine Ratio: 18 (ref 12–28)
BUN: 15 mg/dL (ref 8–27)
Bilirubin Total: 0.5 mg/dL (ref 0.0–1.2)
CO2: 22 mmol/L (ref 20–29)
Calcium: 10.5 mg/dL — ABNORMAL HIGH (ref 8.7–10.3)
Chloride: 102 mmol/L (ref 96–106)
Creatinine, Ser: 0.83 mg/dL (ref 0.57–1.00)
Globulin, Total: 2.4 g/dL (ref 1.5–4.5)
Glucose: 97 mg/dL (ref 70–99)
Potassium: 5 mmol/L (ref 3.5–5.2)
Sodium: 141 mmol/L (ref 134–144)
Total Protein: 7.4 g/dL (ref 6.0–8.5)
eGFR: 78 mL/min/{1.73_m2}

## 2024-05-03 LAB — HEMOGLOBIN A1C
Est. average glucose Bld gHb Est-mCnc: 108 mg/dL
Hgb A1c MFr Bld: 5.4 % (ref 4.8–5.6)

## 2024-05-03 LAB — LIPID PANEL WITH LDL/HDL RATIO
Cholesterol, Total: 236 mg/dL — ABNORMAL HIGH (ref 100–199)
HDL: 50 mg/dL
LDL Chol Calc (NIH): 152 mg/dL — ABNORMAL HIGH (ref 0–99)
LDL/HDL Ratio: 3 ratio (ref 0.0–3.2)
Triglycerides: 190 mg/dL — ABNORMAL HIGH (ref 0–149)
VLDL Cholesterol Cal: 34 mg/dL (ref 5–40)

## 2024-05-03 LAB — VITAMIN D 25 HYDROXY (VIT D DEFICIENCY, FRACTURES): Vit D, 25-Hydroxy: 25.4 ng/mL — ABNORMAL LOW (ref 30.0–100.0)

## 2024-05-04 ENCOUNTER — Ambulatory Visit: Payer: Self-pay | Admitting: Family Medicine

## 2024-05-04 DIAGNOSIS — E7800 Pure hypercholesterolemia, unspecified: Secondary | ICD-10-CM

## 2024-05-05 MED ORDER — ATORVASTATIN CALCIUM 20 MG PO TABS: 20.0000 mg | ORAL_TABLET | Freq: Every day | ORAL | 1 refills | Status: AC

## 2024-10-10 ENCOUNTER — Encounter: Admitting: Dermatology

## 2025-05-03 ENCOUNTER — Ambulatory Visit: Admitting: Family Medicine
